# Patient Record
Sex: Female | Born: 1990 | Race: White | Hispanic: No | Marital: Single
Health system: Midwestern US, Community
[De-identification: ages and names within clinical notes are randomized; demographics above are authoritative.]

## PROBLEM LIST (undated history)

## (undated) DIAGNOSIS — E039 Hypothyroidism, unspecified: Secondary | ICD-10-CM

## (undated) DIAGNOSIS — J40 Bronchitis, not specified as acute or chronic: Secondary | ICD-10-CM

## (undated) DIAGNOSIS — J45909 Unspecified asthma, uncomplicated: Secondary | ICD-10-CM

## (undated) HISTORY — PX: COLONOSCOPY: SHX174

## (undated) HISTORY — PX: WISDOM TOOTH EXTRACTION: SHX21

---

## 2015-07-30 ENCOUNTER — Emergency Department: Payer: BLUE CROSS/BLUE SHIELD

## 2015-07-30 ENCOUNTER — Emergency Department
Admission: EM | Admit: 2015-07-30 | Discharge: 2015-07-30 | Disposition: A | Discharge status: Home / Self Care | Payer: BLUE CROSS/BLUE SHIELD | Attending: Emergency Medicine | Admitting: Emergency Medicine

## 2015-07-30 DIAGNOSIS — Z3202 Encounter for pregnancy test, result negative: Secondary | ICD-10-CM | POA: Diagnosis not present

## 2015-07-30 DIAGNOSIS — R1013 Epigastric pain: Secondary | ICD-10-CM

## 2015-07-30 DIAGNOSIS — R109 Unspecified abdominal pain: Secondary | ICD-10-CM | POA: Diagnosis present

## 2015-07-30 DIAGNOSIS — G8929 Other chronic pain: Secondary | ICD-10-CM | POA: Diagnosis not present

## 2015-07-30 LAB — URINALYSIS COMPLETE WITH MICROSCOPIC (ARMC ONLY)
Bacteria, UA: NONE SEEN
Bilirubin Urine: NEGATIVE
Glucose, UA: NEGATIVE mg/dL
Hgb urine dipstick: NEGATIVE
LEUKOCYTES UA: NEGATIVE
NITRITE: NEGATIVE
PROTEIN: NEGATIVE mg/dL
SPECIFIC GRAVITY, URINE: 1.028 (ref 1.005–1.030)
pH: 5 (ref 5.0–8.0)

## 2015-07-30 LAB — CBC WITH DIFFERENTIAL/PLATELET
BASOS ABS: 0.1 10*3/uL (ref 0–0.1)
BASOS PCT: 1 %
EOS ABS: 0.2 10*3/uL (ref 0–0.7)
Eosinophils Relative: 3 %
HCT: 36.2 % (ref 35.0–47.0)
HEMOGLOBIN: 12.5 g/dL (ref 12.0–16.0)
Lymphocytes Relative: 31 %
Lymphs Abs: 2.6 10*3/uL (ref 1.0–3.6)
MCH: 30.3 pg (ref 26.0–34.0)
MCHC: 34.5 g/dL (ref 32.0–36.0)
MCV: 87.8 fL (ref 80.0–100.0)
Monocytes Absolute: 0.5 10*3/uL (ref 0.2–0.9)
Monocytes Relative: 6 %
NEUTROS PCT: 59 %
Neutro Abs: 4.9 10*3/uL (ref 1.4–6.5)
Platelets: 292 10*3/uL (ref 150–440)
RBC: 4.12 MIL/uL (ref 3.80–5.20)
RDW: 13 % (ref 11.5–14.5)
WBC: 8.2 10*3/uL (ref 3.6–11.0)

## 2015-07-30 LAB — COMPREHENSIVE METABOLIC PANEL
ALK PHOS: 81 U/L (ref 38–126)
ALT: 18 U/L (ref 14–54)
ANION GAP: 8 (ref 5–15)
AST: 18 U/L (ref 15–41)
Albumin: 3.4 g/dL — ABNORMAL LOW (ref 3.5–5.0)
BUN: 14 mg/dL (ref 6–20)
CALCIUM: 8.7 mg/dL — AB (ref 8.9–10.3)
CO2: 23 mmol/L (ref 22–32)
CREATININE: 0.83 mg/dL (ref 0.44–1.00)
Chloride: 107 mmol/L (ref 101–111)
Glucose, Bld: 95 mg/dL (ref 65–99)
Potassium: 3.8 mmol/L (ref 3.5–5.1)
Sodium: 138 mmol/L (ref 135–145)
Total Bilirubin: 0.2 mg/dL — ABNORMAL LOW (ref 0.3–1.2)
Total Protein: 6.8 g/dL (ref 6.5–8.1)

## 2015-07-30 LAB — LIPASE, BLOOD: LIPASE: 23 U/L (ref 22–51)

## 2015-07-30 LAB — POCT PREGNANCY, URINE: Preg Test, Ur: NEGATIVE

## 2015-07-30 MED ORDER — DICYCLOMINE HCL 20 MG PO TABS: 20.0000 mg | ORAL_TABLET | Freq: Three times a day (TID) | ORAL | Status: AC | PRN

## 2015-07-30 MED ORDER — DICYCLOMINE HCL 20 MG PO TABS
20.0000 mg | ORAL_TABLET | Freq: Once | ORAL | Status: AC
  Administered 2015-07-30: 20 mg via ORAL
  Filled 2015-07-30: qty 1

## 2015-07-30 NOTE — ED Provider Notes (Signed)
[facility] Emergency Department Provider Note     Time seen: ----------------------------------------- 8:51 PM on 07/30/2015 -----------------------------------------    I have reviewed the triage vital signs and the nursing notes.   HISTORY  Chief Complaint Abdominal Pain    HPI Maureen Santos is a 24 y.o. female who presents ER with mid abdominal pain since last week. Patient said some black stools today, does know she recently took some Pepto-Bismol. Patient states she has chronic abdominal pain, recently moved here from [place]. Has a chronic history of constipation or diarrhea. The GI doctor locally here yet.   No past medical history on file.  There are no active problems to display for this patient.   No past surgical history on file.  Allergies Ibuprofen; Methylprednisolone; Tramadol; Zithromax; and Zoloft  Social History Social History  Substance Use Topics  . Smoking status: Not on file  . Smokeless tobacco: Not on file  . Alcohol Use: Not on file    Review of Systems Constitutional: Negative for fever. Eyes: Negative for visual changes. ENT: Negative for sore throat. Cardiovascular: Negative for chest pain. Respiratory: Negative for shortness of breath. Gastrointestinal: Positive for abdominal pain, negative for vomiting and diarrhea. Genitourinary: Negative for dysuria. Musculoskeletal: Negative for back pain. Skin: Negative for rash. Neurological: Negative for headaches, focal weakness or numbness.  10-point ROS otherwise negative.  ____________________________________________   PHYSICAL EXAM:  VITAL SIGNS: ED Triage Vitals  Enc Vitals Group     BP 07/30/15 1925 145/89 mmHg     Pulse Rate 07/30/15 1925 82     Resp 07/30/15 1925 18     Temp 07/30/15 1925 98.7 F (37.1 C)     Temp Source 07/30/15 1925 Oral     SpO2 07/30/15 1925 97 %     Weight 07/30/15 1925 390 lb (176.903 kg)     Height 07/30/15  1925 5\' 10"  (1.778 m)     Head Cir --      Peak Flow --      Pain Score 07/30/15 1926 6     Pain Loc --      Pain Edu? --      Excl. in GC? --     Constitutional: Alert and oriented. Well appearing and in no distress. Eyes: Conjunctivae are normal. PERRL. Normal extraocular movements. ENT   Head: Normocephalic and atraumatic.   Nose: No congestion/rhinnorhea.   Mouth/Throat: Mucous membranes are moist.   Neck: No stridor. Cardiovascular: Normal rate, regular rhythm. Normal and symmetric distal pulses are present in all extremities. No murmurs, rubs, or gallops. Respiratory: Normal respiratory effort without tachypnea nor retractions. Breath sounds are clear and equal bilaterally. No wheezes/rales/rhonchi. Gastrointestinal: Soft and nontender. No distention. No abdominal bruits.  Musculoskeletal: Nontender with normal range of motion in all extremities. No joint effusions.  No lower extremity tenderness nor edema. Neurologic:  Normal speech and language. No gross focal neurologic deficits are appreciated. Speech is normal. No gait instability. Skin:  Skin is warm, dry and intact. No rash noted. Psychiatric: Mood and affect are normal. Speech and behavior are normal. Patient exhibits appropriate insight and judgment.  ____________________________________________  ED COURSE:  Pertinent labs & imaging results that were available during my care of the patient were reviewed by me and considered in my medical decision making (see chart for details). Patient with nonspecific likely chronic abdominal pain, will check labs and basic x-rays ____________________________________________    LABS (pertinent positives/negatives)  Labs Reviewed  COMPREHENSIVE METABOLIC PANEL -  Abnormal; Notable for the following:    Calcium 8.7 (*)    Albumin 3.4 (*)    Total Bilirubin 0.2 (*)    All other components within normal limits  URINALYSIS COMPLETEWITH MICROSCOPIC ([facility] ONLY) -  Abnormal; Notable for the following:    Color, Urine YELLOW (*)    APPearance CLEAR (*)    Ketones, ur TRACE (*)    Squamous Epithelial / LPF 0-5 (*)    All other components within normal limits  LIPASE, BLOOD  CBC WITH DIFFERENTIAL/PLATELET  POC URINE PREG, ED  POCT PREGNANCY, URINE    RADIOLOGY Images were viewed by me  two-view abdomen IMPRESSION: Possible colonic inflammation. This would be better evaluated with CT of the abdomen and pelvis. ____________________________________________  FINAL ASSESSMENT AND PLAN  Abdominal pain  Plan: Patient with labs and imaging as dictated above. Patient with acute on chronic abdominal pain. Will be discharged with Bentyl, she's can be referred to a primary care doctor for follow-up.   Emily Filbert, MD   Emily Filbert, MD 07/30/15 [phone]

## 2015-07-30 NOTE — ED Notes (Signed)
Mid abd pain since last week, had black stools today no vomiting.

## 2015-07-30 NOTE — Discharge Instructions (Signed)

## 2015-09-15 ENCOUNTER — Encounter: Payer: Self-pay | Admitting: *Deleted

## 2015-09-15 ENCOUNTER — Emergency Department: Payer: BLUE CROSS/BLUE SHIELD

## 2015-09-15 ENCOUNTER — Emergency Department
Admission: EM | Admit: 2015-09-15 | Discharge: 2015-09-16 | Disposition: A | Discharge status: Home / Self Care | Payer: BLUE CROSS/BLUE SHIELD | Attending: Emergency Medicine | Admitting: Emergency Medicine

## 2015-09-15 DIAGNOSIS — Z3202 Encounter for pregnancy test, result negative: Secondary | ICD-10-CM | POA: Diagnosis not present

## 2015-09-15 DIAGNOSIS — R1011 Right upper quadrant pain: Secondary | ICD-10-CM | POA: Diagnosis present

## 2015-09-15 DIAGNOSIS — F419 Anxiety disorder, unspecified: Secondary | ICD-10-CM | POA: Insufficient documentation

## 2015-09-15 DIAGNOSIS — R109 Unspecified abdominal pain: Secondary | ICD-10-CM

## 2015-09-15 DIAGNOSIS — Z720 Tobacco use: Secondary | ICD-10-CM | POA: Insufficient documentation

## 2015-09-15 DIAGNOSIS — R002 Palpitations: Secondary | ICD-10-CM | POA: Insufficient documentation

## 2015-09-15 LAB — BASIC METABOLIC PANEL
ANION GAP: 8 (ref 5–15)
BUN: 18 mg/dL (ref 6–20)
CALCIUM: 8.8 mg/dL — AB (ref 8.9–10.3)
CO2: 23 mmol/L (ref 22–32)
Chloride: 108 mmol/L (ref 101–111)
Creatinine, Ser: 1.06 mg/dL — ABNORMAL HIGH (ref 0.44–1.00)
Glucose, Bld: 99 mg/dL (ref 65–99)
Potassium: 3.8 mmol/L (ref 3.5–5.1)
SODIUM: 139 mmol/L (ref 135–145)

## 2015-09-15 LAB — CBC WITH DIFFERENTIAL/PLATELET
BASOS ABS: 0.1 10*3/uL (ref 0–0.1)
BASOS PCT: 1 %
Eosinophils Absolute: 0.2 10*3/uL (ref 0–0.7)
Eosinophils Relative: 2 %
HEMATOCRIT: 38.5 % (ref 35.0–47.0)
Hemoglobin: 13.2 g/dL (ref 12.0–16.0)
Lymphocytes Relative: 24 %
Lymphs Abs: 3 10*3/uL (ref 1.0–3.6)
MCH: 30.3 pg (ref 26.0–34.0)
MCHC: 34.4 g/dL (ref 32.0–36.0)
MCV: 88 fL (ref 80.0–100.0)
MONO ABS: 0.6 10*3/uL (ref 0.2–0.9)
Monocytes Relative: 5 %
NEUTROS ABS: 8.8 10*3/uL — AB (ref 1.4–6.5)
NEUTROS PCT: 68 %
Platelets: 355 10*3/uL (ref 150–440)
RBC: 4.37 MIL/uL (ref 3.80–5.20)
RDW: 12.6 % (ref 11.5–14.5)
WBC: 12.7 10*3/uL — ABNORMAL HIGH (ref 3.6–11.0)

## 2015-09-15 LAB — URINALYSIS COMPLETE WITH MICROSCOPIC (ARMC ONLY)
BILIRUBIN URINE: NEGATIVE
GLUCOSE, UA: NEGATIVE mg/dL
Hgb urine dipstick: NEGATIVE
LEUKOCYTES UA: NEGATIVE
NITRITE: NEGATIVE
Protein, ur: NEGATIVE mg/dL
SPECIFIC GRAVITY, URINE: 1.029 (ref 1.005–1.030)
pH: 5 (ref 5.0–8.0)

## 2015-09-15 NOTE — ED Notes (Signed)
Pt has abd pain with cramping for 1 day.  Menses now.  No dysuria.  Pt also has a headache.

## 2015-09-15 NOTE — ED Provider Notes (Signed)
[facility] Emergency Department Provider Note  ____________________________________________  Time seen: on arrival  I have reviewed the triage vital signs and the nursing notes.   HISTORY  Chief Complaint Abdominal Pain    HPI Maureen Santos is a 24 y.o. female who presents with complaints of abdominal pain. She reports the pain is crampy and is somewhat diffuse but is perhaps primarily in the right upper quadrant. She notes a long history of vague abdominal pain for which she has been worked up in [place]  and no abnormalities were found. She reports she came today becausethe pain became worse. She reports cramping in the right upper quadrant and also that she had heart palpitations earlier today which is now resolved. She denies nausea vomiting. She is never had abdominal surgeries     No past medical history on file.  There are no active problems to display for this patient.   No past surgical history on file.  Current Outpatient Rx  Name  Route  Sig  Dispense  Refill  . dicyclomine (BENTYL) 20 MG tablet   Oral   Take 1 tablet (20 mg total) by mouth 3 (three) times daily as needed for spasms.   20 tablet   0     Allergies Ibuprofen; Methylprednisolone; Tramadol; Zithromax; and Zoloft  No family history on file.  Social History Social History  Substance Use Topics  . Smoking status: Current Every Day Smoker  . Smokeless tobacco: None  . Alcohol Use: Yes    Review of Systems  Constitutional: Negative for fever. Eyes: Negative for visual changes. ENT: Negative for sore throat Cardiovascular: Negative for chest pain. Respiratory: Negative for shortness of breath. Gastrointestinal: positive for abdominal pain Genitourinary: Negative for dysuria. Musculoskeletal: Negative for back pain. Skin: Negative for rash. Neurological: Negative for headaches or focal weakness Psychiatric:positive for  anxiety    ____________________________________________   PHYSICAL EXAM:  VITAL SIGNS: ED Triage Vitals  Enc Vitals Group     BP 09/15/15 2134 128/88 mmHg     Pulse Rate 09/15/15 2132 102     Resp 09/15/15 2132 20     Temp 09/15/15 2132 98.8 F (37.1 C)     Temp Source 09/15/15 2132 Oral     SpO2 09/15/15 2132 96 %     Weight 09/15/15 2132 380 lb (172.367 kg)     Height 09/15/15 2132  (1.753 m)     Head Cir --      Peak Flow --      Pain Score 09/15/15 2133 8     Pain Loc --      Pain Edu? --      Excl. in GC? --     Constitutional: Alert and oriented. Well appearing and in no distress. Eyes: Conjunctivae are normal.  ENT   Head: Normocephalic and atraumatic.   Mouth/Throat: Mucous membranes are moist. Cardiovascular: Normal rate, regular rhythm. Normal and symmetric distal pulses are present in all extremities. No murmurs, rubs, or gallops. Respiratory: Normal respiratory effort without tachypnea nor retractions. Breath sounds are clear and equal bilaterally.  Gastrointestinal: patient appears to be tender in almost all quadrants but primarily in the right upper quadrant. nonsurgical abdomen.No distention. There is no CVA tenderness. Genitourinary: deferred Musculoskeletal: Nontender with normal range of motion in all extremities. No lower extremity tenderness nor edema. Neurologic:  Normal speech and language. No gross focal neurologic deficits are appreciated. Skin:  Skin is warm, dry and intact. No rash noted. Psychiatric:  Mood and affect are normal. Patient exhibits appropriate insight and judgment.  ____________________________________________    LABS (pertinent positives/negatives)  Labs Reviewed  CBC WITH DIFFERENTIAL/PLATELET - Abnormal; Notable for the following:    WBC 12.7 (*)    Neutro Abs 8.8 (*)    All other components within normal limits  BASIC METABOLIC PANEL - Abnormal; Notable for the following:    Creatinine, Ser 1.06 (*)     Calcium 8.8 (*)    All other components within normal limits  URINALYSIS COMPLETEWITH MICROSCOPIC ([facility] ONLY) - Abnormal; Notable for the following:    Color, Urine YELLOW (*)    APPearance CLEAR (*)    Ketones, ur TRACE (*)    Bacteria, UA RARE (*)    Squamous Epithelial / LPF 0-5 (*)    All other components within normal limits    ____________________________________________   EKG  None  ____________________________________________    RADIOLOGY I have personally reviewed any xrays that were ordered on this patient: Ultrasound of the right upper quadrant is pending  ____________________________________________   PROCEDURES  Procedure(s) performed: none  Critical Care performed: none  ____________________________________________   INITIAL IMPRESSION / ASSESSMENT AND PLAN / ED COURSE  Pertinent labs & imaging results that were available during my care of the patient were reviewed by me and considered in my medical decision making (see chart for details).  Patient with what appears to be relatively chronic abdominal pain. There may be a component of worsening pain in the right upper quadrant today. We will check an ultrasound of the gallbladder to evaluate for possible biliary colic. Her labs are reassuring except for a mildly elevated white blood cell count.  I will sign the patient to Dr. Manson PasseyBrown and have asked him to follow up on the ultrasound  ____________________________________________   FINAL CLINICAL IMPRESSION(S) / ED DIAGNOSES  Final diagnoses:  Abdominal pain     Jene Everyobert Mancel Lardizabal, MD 09/15/15 2320

## 2015-09-16 ENCOUNTER — Emergency Department: Payer: BLUE CROSS/BLUE SHIELD

## 2015-09-16 LAB — PREGNANCY, URINE: Preg Test, Ur: NEGATIVE

## 2015-09-16 MED ORDER — IOHEXOL 300 MG/ML  SOLN
150.0000 mL | Freq: Once | INTRAMUSCULAR | Status: AC | PRN
  Administered 2015-09-16: 150 mL via INTRAVENOUS

## 2015-09-16 MED ORDER — IOHEXOL 240 MG/ML SOLN
25.0000 mL | Freq: Once | INTRAMUSCULAR | Status: AC | PRN
  Administered 2015-09-16: 25 mL via ORAL

## 2015-09-16 NOTE — ED Provider Notes (Signed)
I assumed care of the patient 11:00 PM from Dr. Cyril LoosenKinner. Ultrasound results revealed  US Abdomen Limited RUQ (Final result) Result time: 09/15/15 23:31:08   Final result by Rad Results In Interface (09/15/15 23:31:08)   Narrative:   CLINICAL DATA: Acute onset of right upper quadrant abdominal pain. Initial encounter.  EXAM: US ABDOMEN LIMITED - RIGHT UPPER QUADRANT  COMPARISON: None.  FINDINGS: Gallbladder:  No gallstones or wall thickening visualized. No sonographic Murphy sign noted.  Common bile duct:  Diameter: 0.3 cm, within normal limits in caliber.  Liver:  No focal lesion identified. Within normal limits in parenchymal echogenicity.  IMPRESSION: Unremarkable ultrasound of the right upper quadrant.   Electronically Signed By: Roanna RaiderJeffery Chang M.D. On: 09/15/2015 23:31         I evaluated patient who admitted to generalized abdominal pain. Tender to palpation in all quadrants as such CT scan of the abdomen and pelvis  Performed    CT Abdomen Pelvis W Contrast (Final result) Result time: 09/16/15 02:57:35   Final result by Rad Results In Interface (09/16/15 02:57:35)   Narrative:   CLINICAL DATA: Acute onset of lower abdominal pain and cramping. Headache. Initial encounter.  EXAM: CT ABDOMEN AND PELVIS WITH CONTRAST  TECHNIQUE: Multidetector CT imaging of the abdomen and pelvis was performed using the standard protocol following bolus administration of intravenous contrast.  CONTRAST: 150mL OMNIPAQUE IOHEXOL 300 MG/ML SOLN  COMPARISON: Ultrasound of the right upper quadrant performed 09/14/2014  FINDINGS: The visualized lung bases are clear.  The liver and spleen are unremarkable in appearance. The gallbladder is within normal limits. The pancreas and adrenal glands are unremarkable.  The kidneys are unremarkable in appearance. There is no evidence of hydronephrosis. No renal or ureteral stones are seen. No perinephric stranding  is appreciated.  No free fluid is identified. The small bowel is unremarkable in appearance. The stomach is within normal limits. No acute vascular abnormalities are seen.  The appendix is normal in caliber, without evidence of appendicitis. The colon is unremarkable in appearance.  The bladder is mildly distended and grossly unremarkable. The uterus is unremarkable in appearance. The ovaries are relatively symmetric. No suspicious adnexal masses are seen. No inguinal lymphadenopathy is seen.  No acute osseous abnormalities are identified.  IMPRESSION: Unremarkable contrast-enhanced CT of the abdomen and pelvis.   Electronically Signed By: Roanna RaiderJeffery Chang M.D. On: 09/16/2015 02:57   Spoke with the patient at length that she stated that she's had issues like this in the past which she saw a gastroenterologist but no clear etiology was discovered. Patient stated she had a colonoscopy performed 2 years ago which showed 2 polyps. I will refer the patient to Dr. Marva PandaSkulskie gastroenterologist on call  Darci Currentandolph N Brown, MD 09/16/15 [phone]

## 2015-09-16 NOTE — Discharge Instructions (Signed)

## 2015-11-12 ENCOUNTER — Emergency Department: Payer: BLUE CROSS/BLUE SHIELD

## 2015-11-12 ENCOUNTER — Emergency Department
Admission: EM | Admit: 2015-11-12 | Discharge: 2015-11-12 | Disposition: A | Discharge status: Home / Self Care | Payer: BLUE CROSS/BLUE SHIELD | Attending: Emergency Medicine | Admitting: Emergency Medicine

## 2015-11-12 ENCOUNTER — Encounter: Payer: Self-pay | Admitting: Emergency Medicine

## 2015-11-12 DIAGNOSIS — Z3202 Encounter for pregnancy test, result negative: Secondary | ICD-10-CM | POA: Insufficient documentation

## 2015-11-12 DIAGNOSIS — R1084 Generalized abdominal pain: Secondary | ICD-10-CM

## 2015-11-12 DIAGNOSIS — R110 Nausea: Secondary | ICD-10-CM | POA: Insufficient documentation

## 2015-11-12 DIAGNOSIS — F17200 Nicotine dependence, unspecified, uncomplicated: Secondary | ICD-10-CM | POA: Insufficient documentation

## 2015-11-12 DIAGNOSIS — R109 Unspecified abdominal pain: Secondary | ICD-10-CM | POA: Diagnosis present

## 2015-11-12 LAB — CBC WITH DIFFERENTIAL/PLATELET
Basophils Absolute: 0.1 10*3/uL (ref 0–0.1)
Basophils Relative: 1 %
EOS ABS: 0.2 10*3/uL (ref 0–0.7)
EOS PCT: 2 %
HCT: 39.9 % (ref 35.0–47.0)
HEMOGLOBIN: 13.3 g/dL (ref 12.0–16.0)
LYMPHS ABS: 3.6 10*3/uL (ref 1.0–3.6)
LYMPHS PCT: 31 %
MCH: 29.6 pg (ref 26.0–34.0)
MCHC: 33.3 g/dL (ref 32.0–36.0)
MCV: 89 fL (ref 80.0–100.0)
MONOS PCT: 4 %
Monocytes Absolute: 0.5 10*3/uL (ref 0.2–0.9)
NEUTROS PCT: 62 %
Neutro Abs: 7.1 10*3/uL — ABNORMAL HIGH (ref 1.4–6.5)
Platelets: 343 10*3/uL (ref 150–440)
RBC: 4.48 MIL/uL (ref 3.80–5.20)
RDW: 12.1 % (ref 11.5–14.5)
WBC: 11.6 10*3/uL — ABNORMAL HIGH (ref 3.6–11.0)

## 2015-11-12 LAB — URINALYSIS COMPLETE WITH MICROSCOPIC (ARMC ONLY)
Bilirubin Urine: NEGATIVE
Glucose, UA: NEGATIVE mg/dL
LEUKOCYTES UA: NEGATIVE
NITRITE: NEGATIVE
PH: 5 (ref 5.0–8.0)
PROTEIN: 30 mg/dL — AB
Specific Gravity, Urine: 1.031 — ABNORMAL HIGH (ref 1.005–1.030)

## 2015-11-12 LAB — COMPREHENSIVE METABOLIC PANEL
ALK PHOS: 91 U/L (ref 38–126)
ALT: 24 U/L (ref 14–54)
ANION GAP: 9 (ref 5–15)
AST: 20 U/L (ref 15–41)
Albumin: 3.9 g/dL (ref 3.5–5.0)
BILIRUBIN TOTAL: 0.6 mg/dL (ref 0.3–1.2)
BUN: 21 mg/dL — ABNORMAL HIGH (ref 6–20)
CALCIUM: 9.1 mg/dL (ref 8.9–10.3)
CO2: 25 mmol/L (ref 22–32)
Chloride: 107 mmol/L (ref 101–111)
Creatinine, Ser: 0.98 mg/dL (ref 0.44–1.00)
Glucose, Bld: 136 mg/dL — ABNORMAL HIGH (ref 65–99)
Potassium: 3.9 mmol/L (ref 3.5–5.1)
SODIUM: 141 mmol/L (ref 135–145)
TOTAL PROTEIN: 7.6 g/dL (ref 6.5–8.1)

## 2015-11-12 LAB — PREGNANCY, URINE: PREG TEST UR: NEGATIVE

## 2015-11-12 LAB — POCT PREGNANCY, URINE: PREG TEST UR: NEGATIVE

## 2015-11-12 LAB — LIPASE, BLOOD: LIPASE: 23 U/L (ref 11–51)

## 2015-11-12 MED ORDER — DICYCLOMINE HCL 20 MG PO TABS: 20.0000 mg | ORAL_TABLET | Freq: Three times a day (TID) | ORAL | Status: AC | PRN

## 2015-11-12 MED ORDER — IOHEXOL 350 MG/ML SOLN
125.0000 mL | Freq: Once | INTRAVENOUS | Status: AC | PRN
  Administered 2015-11-12: 125 mL via INTRAVENOUS

## 2015-11-12 MED ORDER — DICYCLOMINE HCL 10 MG PO CAPS
10.0000 mg | ORAL_CAPSULE | Freq: Once | ORAL | Status: AC
Start: 2015-11-12 — End: 2015-11-12
  Administered 2015-11-12: 10 mg via ORAL
  Filled 2015-11-12: qty 1

## 2015-11-12 MED ORDER — IOHEXOL 240 MG/ML SOLN
25.0000 mL | Freq: Once | INTRAMUSCULAR | Status: AC | PRN
  Administered 2015-11-12: 25 mL via ORAL

## 2015-11-12 NOTE — ED Provider Notes (Signed)
[facility] Emergency Department Provider Note  ____________________________________________  Time seen: 2:30 AM  I have reviewed the triage vital signs and the nursing notes.   HISTORY  Chief Complaint Abdominal Pain     HPI Maureen Santos is a 24 y.o. female presents with right upper quadrant/right lower quadrant 8 out of 10 abdominal pain4 days. Patient admits to nausea however no vomiting or diarrhea. Patient denies any dysuria no urinary frequency or urgency.patient states that she isconcerned that this may be "appendicitis"     Past medical history none  There are no active problems to display for this patient.   Past surgical history None  Current Outpatient Rx  Name  Route  Sig  Dispense  Refill  . dicyclomine (BENTYL) 20 MG tablet   Oral   Take 1 tablet (20 mg total) by mouth 3 (three) times daily as needed for spasms.   20 tablet   0     Allergies Ibuprofen; Methylprednisolone; Tramadol; Zithromax; and Zoloft  History reviewed. No pertinent family history.  Social History Social History  Substance Use Topics  . Smoking status: Current Every Day Smoker  . Smokeless tobacco: None  . Alcohol Use: Yes    Review of Systems  Constitutional: Negative for fever. Eyes: Negative for visual changes. ENT: Negative for sore throat. Cardiovascular: Negative for chest pain. Respiratory: Negative for shortness of breath. Gastrointestinal:positive for abdominal pain and nausea Genitourinary: Negative for dysuria. Musculoskeletal: Negative for back pain. Skin: Negative for rash. Neurological: Negative for headaches, focal weakness or numbness.   10-point ROS otherwise negative.  ____________________________________________   PHYSICAL EXAM:  VITAL SIGNS: ED Triage Vitals  Enc Vitals Group     BP 11/12/15 0021 137/90 mmHg     Pulse Rate 11/12/15 0021 96     Resp 11/12/15 0244 18     Temp 11/12/15 0021 97.9 F (36.6 C)      Temp Source 11/12/15 0021 Oral     SpO2 11/12/15 0021 96 %     Weight 11/12/15 0021 397 lb (180.078 kg)     Height 11/12/15 0021 5\' 9"  (1.753 m)     Head Cir --      Peak Flow --      Pain Score 11/12/15 0021 8     Pain Loc --      Pain Edu? --      Excl. in GC? --      Constitutional: Alert and oriented. Well appearing and in no distress. Eyes: Conjunctivae are normal. PERRL. Normal extraocular movements. ENT   Head: Normocephalic and atraumatic.   Nose: No congestion/rhinnorhea.   Mouth/Throat: Mucous membranes are moist.   Neck: No stridor. Hematological/Lymphatic/Immunilogical: No cervical lymphadenopathy. Cardiovascular: Normal rate, regular rhythm. Normal and symmetric distal pulses are present in all extremities. No murmurs, rubs, or gallops. Respiratory: Normal respiratory effort without tachypnea nor retractions. Breath sounds are clear and equal bilaterally. No wheezes/rales/rhonchi. Gastrointestinal: right upper quadrant/right lower quadrant tenderness to palpation No distention. There is no CVA tenderness. Genitourinary: deferred Musculoskeletal: Nontender with normal range of motion in all extremities. No joint effusions.  No lower extremity tenderness nor edema. Neurologic:  Normal speech and language. No gross focal neurologic deficits are appreciated. Speech is normal.  Skin:  Skin is warm, dry and intact. No rash noted. Psychiatric: Mood and affect are normal. Speech and behavior are normal. Patient exhibits appropriate insight and judgment.  ____________________________________________    LABS (pertinent positives/negatives)  Labs Reviewed  CBC WITH  DIFFERENTIAL/PLATELET - Abnormal; Notable for the following:    WBC 11.6 (*)    Neutro Abs 7.1 (*)    All other components within normal limits  COMPREHENSIVE METABOLIC PANEL - Abnormal; Notable for the following:    Glucose, Bld 136 (*)    BUN 21 (*)    All other components within normal  limits  URINALYSIS COMPLETEWITH MICROSCOPIC ([facility] ONLY) - Abnormal; Notable for the following:    Color, Urine YELLOW (*)    APPearance HAZY (*)    Ketones, ur TRACE (*)    Specific Gravity, Urine 1.031 (*)    Hgb urine dipstick 1+ (*)    Protein, ur 30 (*)    Bacteria, UA RARE (*)    Squamous Epithelial / LPF 0-5 (*)    All other components within normal limits  LIPASE, BLOOD  PREGNANCY, URINE  POCT PREGNANCY, URINE     ________________________________ RADIOLOGY    CT Abdomen Pelvis W Contrast (Final result) Result time: 11/12/15 03:47:43   Final result by Rad Results In Interface (11/12/15 03:47:43)   Narrative:   CLINICAL DATA: RIGHT lower quadrant pain for 4 days, nausea and fever beginning today. Weakness.  EXAM: CT ABDOMEN AND PELVIS WITH CONTRAST  TECHNIQUE: Multidetector CT imaging of the abdomen and pelvis was performed using the standard protocol following bolus administration of intravenous contrast.  CONTRAST: OMNIPAQUE IOHEXOL 350 MG/ML SOLN  COMPARISON: CT abdomen and pelvis September 16, 2015  FINDINGS: Large body habitus results in overall noisy image quality.  LUNG BASES: Included view of the lung bases are clear. Visualized heart and pericardium are unremarkable. Small amount of contrast in the distal esophagus could represent residual or refluxed contrast.  SOLID ORGANS: The liver demonstrates faint hypodensity around the hilum, most compatible with focal fatty infiltration, otherwise unremarkable. Spleen, gallbladder, pancreas and adrenal glands are unremarkable.  GASTROINTESTINAL TRACT: Small hiatal hernia. The stomach, small and large bowel are normal in course and caliber without inflammatory changes. Enteric contrast has not yet reached the distal small bowel. Mild amount of retained large bowel stool. Normal appendix.  KIDNEYS/ URINARY TRACT: Kidneys are orthotopic, demonstrating symmetric enhancement. No nephrolithiasis,  hydronephrosis or solid renal masses. The unopacified ureters are normal in course and caliber. Urinary bladder is partially distended and unremarkable.  PERITONEUM/RETROPERITONEUM: Aortoiliac vessels are normal in course and caliber. No lymphadenopathy by CT size criteria. Subcentimeter RIGHT lower quadrant lymph nodes are likely reactive. Internal reproductive organs are unremarkable. No intraperitoneal free fluid nor free air.  SOFT TISSUE/OSSEOUS STRUCTURES: Non-suspicious. Moderate broad-based disc bulge L3-4 resulting mild canal stenosis.  IMPRESSION: No acute intra-abdominal or pelvic process. Normal appendix.  Mild amount of retained large bowel stool without bowel obstruction.   Electronically Signed By: Awilda Metro M.D.     INITIAL IMPRESSION / ASSESSMENT AND PLAN / ED COURSE  Pertinent labs & imaging results that were available during my care of the patient were reviewed by me and considered in my medical decision making (see chart for details).    ____________________________________________   FINAL CLINICAL IMPRESSION(S) / ED DIAGNOSES  Final diagnoses:  Generalized abdominal pain      Darci Current, MD 11/12/15 [phone]

## 2015-11-12 NOTE — Discharge Instructions (Signed)

## 2015-11-12 NOTE — ED Notes (Signed)
Pt transported to CT via stretcher.  

## 2015-11-22 ENCOUNTER — Encounter: Payer: Self-pay | Admitting: Emergency Medicine

## 2015-11-22 ENCOUNTER — Emergency Department
Admission: EM | Admit: 2015-11-22 | Discharge: 2015-11-22 | Disposition: A | Discharge status: Home / Self Care | Payer: BLUE CROSS/BLUE SHIELD | Attending: Emergency Medicine | Admitting: Emergency Medicine

## 2015-11-22 DIAGNOSIS — T50905A Adverse effect of unspecified drugs, medicaments and biological substances, initial encounter: Secondary | ICD-10-CM

## 2015-11-22 DIAGNOSIS — T363X5A Adverse effect of macrolides, initial encounter: Secondary | ICD-10-CM | POA: Insufficient documentation

## 2015-11-22 DIAGNOSIS — R0789 Other chest pain: Secondary | ICD-10-CM | POA: Insufficient documentation

## 2015-11-22 DIAGNOSIS — R0602 Shortness of breath: Secondary | ICD-10-CM | POA: Insufficient documentation

## 2015-11-22 DIAGNOSIS — F17210 Nicotine dependence, cigarettes, uncomplicated: Secondary | ICD-10-CM | POA: Insufficient documentation

## 2015-11-22 DIAGNOSIS — T7840XA Allergy, unspecified, initial encounter: Secondary | ICD-10-CM | POA: Diagnosis present

## 2015-11-22 HISTORY — DX: Bronchitis, not specified as acute or chronic: J40

## 2015-11-22 MED ORDER — ALBUTEROL SULFATE HFA 108 (90 BASE) MCG/ACT IN AERS
2.0000 | INHALATION_SPRAY | Freq: Four times a day (QID) | RESPIRATORY_TRACT | Status: AC | PRN
Start: 2015-11-22 — End: ?

## 2015-11-22 MED ORDER — CEPHALEXIN 500 MG PO CAPS: 500.0000 mg | ORAL_CAPSULE | Freq: Three times a day (TID) | ORAL | Status: AC

## 2015-11-22 NOTE — ED Provider Notes (Signed)
[facility] Emergency Department Provider Note ____________________________________________  Time seen: Approximately 3:41 PM  I have reviewed the triage vital signs and the nursing notes.   HISTORY  Chief Complaint Allergic Reaction   HPI Maureen Santos is a 24 y.o. female who presents to the emergency department for evaluation of medication reaction. She states that she was started on clarithromycin yesterday by her primary care provider diagnosed her with a sinus infection. She states that intermittently since the second dose she has had to use her albuterol inhaler because she's had a little bit of shortness of breath and chest tightness similar to a previous incident with azithromycin. She denies rash, itching, or hives. She states that after using the albuterol, her symptoms are completely relieved. Her last dose of clarithromycin was 10 AM.   Past Medical History  Diagnosis Date  . Bronchitis     There are no active problems to display for this patient.   History reviewed. No pertinent past surgical history.  Current Outpatient Rx  Name  Route  Sig  Dispense  Refill  . albuterol (PROVENTIL HFA;VENTOLIN HFA) 108 (90 Base) MCG/ACT inhaler   Inhalation   Inhale 2 puffs into the lungs every 6 (six) hours as needed for wheezing or shortness of breath.   1 Inhaler   2   . cephALEXin (KEFLEX) 500 MG capsule   Oral   Take 1 capsule (500 mg total) by mouth 3 (three) times daily.   40 capsule   0   . dicyclomine (BENTYL) 20 MG tablet   Oral   Take 1 tablet (20 mg total) by mouth 3 (three) times daily as needed for spasms.   20 tablet   0   . dicyclomine (BENTYL) 20 MG tablet   Oral   Take 1 tablet (20 mg total) by mouth 3 (three) times daily as needed for spasms.   30 tablet   0     Allergies Clindamycin/lincomycin; Ibuprofen; Methylprednisolone; Tramadol; Zithromax; and Zoloft  No family history on file.  Social History Social  History  Substance Use Topics  . Smoking status: Current Every Day Smoker -- 0.25 packs/day    Types: Cigarettes  . Smokeless tobacco: None  . Alcohol Use: Yes    Review of Systems Constitutional: No fever/chills Eyes: No visual changes. ENT: No sore throat. Negative for feeling of swelling in the throat Cardiovascular: Denies chest pain. Respiratory: Intermittent shortness of breath. Gastrointestinal: No abdominal pain.  No nausea, no vomiting.  No diarrhea.  No constipation. Genitourinary: Negative for dysuria. Musculoskeletal: Negative for back pain. Skin: Negative for rash. Neurological: Negative for headaches, focal weakness or numbness.  10-point ROS otherwise negative.  ____________________________________________   PHYSICAL EXAM:  VITAL SIGNS: ED Triage Vitals  Enc Vitals Group     BP 11/22/15 1358 117/76 mmHg     Pulse Rate 11/22/15 1358 96     Resp 11/22/15 1358 20     Temp 11/22/15 1358 98.5 F (36.9 C)     Temp Source 11/22/15 1358 Oral     SpO2 11/22/15 1358 97 %     Weight 11/22/15 1358 387 lb (175.542 kg)     Height 11/22/15 1358  (1.753 m)     Head Cir --      Peak Flow --      Pain Score 11/22/15 1359 0     Pain Loc --      Pain Edu? --      Excl. in  GC? --     Constitutional: Alert and oriented. Well appearing and in no acute distress. Eyes: Conjunctivae are normal. PERRL. EOMI. Head: Atraumatic. Nose: No congestion/rhinnorhea. Mouth/Throat: Mucous membranes are moist.  Oropharynx non-erythematous. Neck: No stridor.   Cardiovascular: Normal rate, regular rhythm. Grossly normal heart sounds.  Good peripheral circulation. Respiratory: Normal respiratory effort.  No retractions. Lungs CTAB. Gastrointestinal: Soft and nontender. No distention. No abdominal bruits. No CVA tenderness. Musculoskeletal: No lower extremity tenderness nor edema.  No joint effusions. Neurologic:  Normal speech and language. No gross focal neurologic deficits are  appreciated. No gait instability. Skin:  Skin is warm, dry and intact. No rash noted. Psychiatric: Mood and affect are normal. Speech and behavior are normal.  ____________________________________________   LABS (all labs ordered are listed, but only abnormal results are displayed)  Labs Reviewed - No data to display ____________________________________________  EKG   ____________________________________________  RADIOLOGY  Not indicated ____________________________________________   PROCEDURES  Procedure(s) performed: None  Critical Care performed: No  ____________________________________________   INITIAL IMPRESSION / ASSESSMENT AND PLAN / ED COURSE  Pertinent labs & imaging results that were available during my care of the patient were reviewed by me and considered in my medical decision making (see chart for details).  Patient was advised to stop the clarithromycin. She will be placed on Keflex in its place. She was advised to continue using the albuterol inhaler as needed and she will be given a refill as well. She was advised to take Benadryl every 6 hours for the next 24 hours. She was advised to return to the emergency department for any symptom that changes or worsens if she is unable to see her primary care provider. ____________________________________________   FINAL CLINICAL IMPRESSION(S) / ED DIAGNOSES  Final diagnoses:  Medication reaction, initial encounter      Chinita PesterCari B Reyana Leisey, FNP 11/22/15 1545  Phineas SemenGraydon Goodman, MD 11/23/15 1519

## 2015-11-22 NOTE — Discharge Instructions (Signed)
Drug Allergy °Allergic reactions to medicines are common. Some allergic reactions are mild. A delayed type of drug allergy that occurs 1 week or more after exposure to a medicine or vaccine is called serum sickness. A life-threatening, sudden (acute) allergic reaction that involves the whole body is called anaphylaxis. °CAUSES  °"True" drug allergies occur when there is an allergic reaction to a medicine. This is caused by overactivity of the immune system. First, the body becomes sensitized. The immune system is triggered by your first exposure to the medicine. Following this first exposure, future exposure to the same medicine may be life-threatening. °Almost any medicine can cause an allergic reaction. Common ones are: °· Penicillin. °· Sulfonamides (sulfa drugs). °· Local anesthetics. °· X-ray dyes that contain iodine. °SYMPTOMS  °Common symptoms of a minor allergic reaction are: °· Swelling around the mouth. °· An itchy red rash or hives. °· Vomiting or diarrhea. °Anaphylaxis can cause swelling of the mouth and throat. This makes it difficult to breathe and swallow. Severe reactions can be fatal within seconds, even after exposure to only a trace amount of the drug that causes the reaction. °HOME CARE INSTRUCTIONS °· If you are unsure of what caused your reaction, write down: °¨ The names of the medicines you took. °¨ How much medicine you took. °¨ How you took the medicine, such as whether you took a pill, injected the medicine, or applied it to your skin. °¨ All of the things you ate and drank. °¨ The date and time of your reaction. °¨ The symptoms of the reaction. °· You may want to follow up with an allergy specialist after the reaction has cleared in order to be tested to confirm the allergy. It is important to confirm that your reaction is an allergy, not just a side effect to the medicine. If you have a true allergy to a medicine, this may prevent that medicine and related medicines from being given to  you when you are very ill. °· If you have hives or a rash: °¨ Take medicines as directed by your caregiver. °¨ You may use an over-the-counter antihistamine (diphenhydramine) as needed. °¨ Apply cold compresses to the skin or take baths in cool water. Avoid hot baths or showers. °· If you are severely allergic: °¨ Continuous observation after a severe reaction may be needed. Hospitalization is often required. °¨ Wear a medical alert bracelet or necklace stating your allergy. °¨ You and your family must learn how to use an anaphylaxis kit or give an epinephrine injection to temporarily treat an emergency allergic reaction. If you have had a severe reaction, always carry your epinephrine injection or anaphylaxis kit with you. This can be lifesaving if you have a severe reaction. °· Do not drive or perform tasks after treatment until the medicines used to treat your reaction have worn off, or until your caregiver says it is okay. °· If you have a drug allergy that was confirmed by your health care provider: °¨ Carry information about the drug allergy with you at all times. °¨ Always check with a pharmacist before taking any over-the-counter medicine. °SEEK MEDICAL CARE IF:  °· You think you had an allergic reaction. Symptoms usually start within 30 minutes after exposure. °· Symptoms are getting worse rather than better. °· You develop new symptoms. °· The symptoms that brought you to your caregiver return. °SEEK IMMEDIATE MEDICAL CARE IF:  °· You have swelling of the mouth, difficulty breathing, or wheezing. °· You have a tight   feeling in your chest or throat.  You develop hives, swelling, or itching all over your body.  You develop severe vomiting or diarrhea.  You feel faint or pass out. This is an emergency. Use your epinephrine injection or anaphylaxis kit as you have been instructed. Call for emergency medical help. Even if you improve after the injection, you need to be examined at a hospital emergency  department. MAKE SURE YOU:   Understand these instructions.  Will watch your condition.  Will get help right away if you are not doing well or get worse.   This information is not intended to replace advice given to you by your health care provider. Make sure you discuss any questions you have with your health care provider.   Document Released: 11/08/2005 Document Revised: 11/29/2014 Document Reviewed: 06/10/2015 Elsevier Interactive Patient Education [employer].

## 2015-11-22 NOTE — ED Notes (Signed)
Discussed discharge instructions, prescriptions, and follow-up care with patient. No questions or concerns at this time. Pt stable at discharge.  

## 2015-11-22 NOTE — ED Notes (Signed)
Pt has c/o intermittent difficulty breathing which she says she is unclear if it is related to antibiotic usage or infection itself. Pt states she feels tightness and shortness of breath in her chest relieved by albuterol, no c/o swelling, rash, or itching.

## 2015-11-22 NOTE — ED Notes (Signed)
States began clarithromycin yesterday for sinus infection and states has had to use proair inhaler 4 times in past 24 hours which is not usual for her. Concerned may be allergic to antibiotic. Has known allergy to zithromax.

## 2015-11-24 ENCOUNTER — Encounter: Payer: Self-pay | Admitting: Emergency Medicine

## 2015-11-24 ENCOUNTER — Emergency Department: Payer: BLUE CROSS/BLUE SHIELD

## 2015-11-24 ENCOUNTER — Emergency Department
Admission: EM | Admit: 2015-11-24 | Discharge: 2015-11-24 | Disposition: A | Discharge status: Home / Self Care | Payer: BLUE CROSS/BLUE SHIELD | Attending: Emergency Medicine | Admitting: Emergency Medicine

## 2015-11-24 DIAGNOSIS — J45909 Unspecified asthma, uncomplicated: Secondary | ICD-10-CM | POA: Diagnosis not present

## 2015-11-24 DIAGNOSIS — Z792 Long term (current) use of antibiotics: Secondary | ICD-10-CM | POA: Insufficient documentation

## 2015-11-24 DIAGNOSIS — R05 Cough: Secondary | ICD-10-CM | POA: Diagnosis present

## 2015-11-24 DIAGNOSIS — F17210 Nicotine dependence, cigarettes, uncomplicated: Secondary | ICD-10-CM | POA: Insufficient documentation

## 2015-11-24 DIAGNOSIS — J209 Acute bronchitis, unspecified: Secondary | ICD-10-CM

## 2015-11-24 MED ORDER — PSEUDOEPH-BROMPHEN-DM 30-2-10 MG/5ML PO SYRP: 5.0000 mL | ORAL_SOLUTION | Freq: Four times a day (QID) | ORAL | Status: AC | PRN

## 2015-11-24 MED ORDER — IPRATROPIUM-ALBUTEROL 0.5-2.5 (3) MG/3ML IN SOLN
3.0000 mL | Freq: Once | RESPIRATORY_TRACT | Status: AC
Start: 2015-11-24 — End: 2015-11-24
  Administered 2015-11-24: 3 mL via RESPIRATORY_TRACT
  Filled 2015-11-24: qty 3

## 2015-11-24 NOTE — ED Provider Notes (Signed)
[facility] Emergency Department Provider Note  ____________________________________________  Time seen: Approximately 6:57 PM  I have reviewed the triage vital signs and the nursing notes.   HISTORY  Chief Complaint Cough    HPI Maureen Santos is a 25 y.o. female patient state cough for one day. Patient states she's had difficulty breathing onset 2 days ago status post starting a different antibiotics for sinus infection. Patient state she was recently placed on clindamycin 3 days ago but because of difficulty breathing she thought she was having a reaction to the antibiotics. Emergency room clindamycin was discontinued patient started on Keflex. Patient states that is post use Keflex she fine she has to continue using her inhaler. When questioning patient states she last used her inhalers 1630 hrs. today. Patient clothesing reek  of  tobacco smoke and when asked about tobacco usage she states she has not smoke as much as usual in the past 3 days.Patient denies any pain at this time.   Past Medical History  Diagnosis Date  . Bronchitis     There are no active problems to display for this patient.   History reviewed. No pertinent past surgical history.  Current Outpatient Rx  Name  Route  Sig  Dispense  Refill  . albuterol (PROVENTIL HFA;VENTOLIN HFA) 108 (90 Base) MCG/ACT inhaler   Inhalation   Inhale 2 puffs into the lungs every 6 (six) hours as needed for wheezing or shortness of breath.   1 Inhaler   2   . brompheniramine-pseudoephedrine-DM 30-2-10 MG/5ML syrup   Oral   Take 5 mLs by mouth 4 (four) times daily as needed.   120 mL   0   . cephALEXin (KEFLEX) 500 MG capsule   Oral   Take 1 capsule (500 mg total) by mouth 3 (three) times daily.   40 capsule   0   . dicyclomine (BENTYL) 20 MG tablet   Oral   Take 1 tablet (20 mg total) by mouth 3 (three) times daily as needed for spasms.   20 tablet   0   . dicyclomine (BENTYL) 20 MG  tablet   Oral   Take 1 tablet (20 mg total) by mouth 3 (three) times daily as needed for spasms.   30 tablet   0     Allergies Clindamycin/lincomycin; Ibuprofen; Methylprednisolone; Tramadol; Zithromax; Zoloft; and Clarithromycin  No family history on file.  Social History Social History  Substance Use Topics  . Smoking status: Current Every Day Smoker -- 0.25 packs/day    Types: Cigarettes  . Smokeless tobacco: None  . Alcohol Use: Yes    Review of Systems Constitutional: No fever/chills Eyes: No visual changes. ENT: No sore throat. Cardiovascular: Denies chest pain. Respiratory: Denies shortness of breath. Gastrointestinal: No abdominal pain.  No nausea, no vomiting.  No diarrhea.  No constipation. Genitourinary: Negative for dysuria. Musculoskeletal: Negative for back pain. Skin: Negative for rash. Neurological: Negative for headaches, focal weakness or numbness. 10-point ROS otherwise negative.  ____________________________________________   PHYSICAL EXAM:  VITAL SIGNS: ED Triage Vitals  Enc Vitals Group     BP 11/24/15 1825 138/79 mmHg     Pulse Rate 11/24/15 1825 92     Resp 11/24/15 1825 18     Temp 11/24/15 1825 98.2 F (36.8 C)     Temp Source 11/24/15 1825 Oral     SpO2 11/24/15 1825 98 %     Weight 11/24/15 1825 387 lb (175.542 kg)     Height  11/24/15 1825 5\' 9"  (1.753 m)     Head Cir --      Peak Flow --      Pain Score 11/24/15 1828 0     Pain Loc --      Pain Edu? --      Excl. in GC? --     Constitutional: Alert and oriented. Well appearing and in no acute distress. Morbid obesity Eyes: Conjunctivae are normal. PERRL. EOMI. Head: Atraumatic. Nose: No congestion/rhinnorhea. Mouth/Throat: Mucous membranes are moist.  Oropharynx non-erythematous. Neck: No stridor.  No cervical spine tenderness to palpation. Hematological/Lymphatic/Immunilogical: No cervical lymphadenopathy. Cardiovascular: Normal rate, regular rhythm. Grossly normal heart  sounds.  Good peripheral circulation. Respiratory: Normal respiratory effort.  No retractions. Lungs CTAB. Gastrointestinal: Soft and nontender. No distention. No abdominal bruits. No CVA tenderness. Musculoskeletal: No lower extremity tenderness nor edema.  No joint effusions. Neurologic:  Normal speech and language. No gross focal neurologic deficits are appreciated. No gait instability. Skin:  Skin is warm, dry and intact. No rash noted. Psychiatric: Mood and affect are normal. Speech and behavior are normal.  ____________________________________________   LABS (all labs ordered are listed, but only abnormal results are displayed)  Labs Reviewed - No data to display ____________________________________________  EKG   ____________________________________________  RADIOLOGY  No acute findings. Mild bronchial thickening right greater than left. I, Joni Reiningonald K Fidel Caggiano, personally viewed and evaluated these images (plain radiographs) as part of my medical decision making, as well as reviewing the written report by the radiologist.  ____________________________________________   PROCEDURES  Procedure(s) performed: None  Critical Care performed: No  ____________________________________________   INITIAL IMPRESSION / ASSESSMENT AND PLAN / ED COURSE  Pertinent labs & imaging results that were available during my care of the patient were reviewed by me and considered in my medical decision making (see chart for details).  Bronchitis.Continue taking anabiotic as directed. Patient given a prescription for Bromfed-DM and advised to follow-up family doctor in 2-3 days if no improvement. ____________________________________________   FINAL CLINICAL IMPRESSION(S) / ED DIAGNOSES  Final diagnoses:  Bronchitis with asthma, subacute      Joni ReiningRonald K Elizabella Nolet, PA-C 11/24/15 2008  Loleta Roseory Forbach, MD 11/24/15 2330

## 2015-11-24 NOTE — ED Notes (Signed)
Cough x 1 day.  Difficulty breathing, onset Saturday.   Patient diagnosed with Sinus Infection by PCP on Friday.  Seen in ED on Saturday to switch antibiotics because of difficulty breathing and thought she was having a reaction to ANTBX.  Started on Keflex.  Has been using inhaler frequently today, last used at 1630.

## 2015-11-24 NOTE — ED Notes (Signed)
AAOx3.  Skin warm and dry.  NAD 

## 2015-12-04 ENCOUNTER — Encounter: Payer: Self-pay | Admitting: *Deleted

## 2015-12-05 ENCOUNTER — Encounter: Payer: Self-pay | Admitting: *Deleted

## 2015-12-05 ENCOUNTER — Ambulatory Visit: Payer: BLUE CROSS/BLUE SHIELD | Admitting: Anesthesiology

## 2015-12-05 ENCOUNTER — Ambulatory Visit
Admission: RE | Admit: 2015-12-05 | Discharge: 2015-12-05 | Disposition: A | Discharge status: Home / Self Care | Payer: BLUE CROSS/BLUE SHIELD | Source: Ambulatory Visit | Attending: Gastroenterology | Admitting: Gastroenterology

## 2015-12-05 ENCOUNTER — Encounter
Admission: RE | Disposition: A | Discharge status: Home / Self Care | Payer: Self-pay | Source: Ambulatory Visit | Attending: Gastroenterology

## 2015-12-05 DIAGNOSIS — Z881 Allergy status to other antibiotic agents status: Secondary | ICD-10-CM | POA: Insufficient documentation

## 2015-12-05 DIAGNOSIS — F17200 Nicotine dependence, unspecified, uncomplicated: Secondary | ICD-10-CM | POA: Insufficient documentation

## 2015-12-05 DIAGNOSIS — K219 Gastro-esophageal reflux disease without esophagitis: Secondary | ICD-10-CM | POA: Diagnosis not present

## 2015-12-05 DIAGNOSIS — R194 Change in bowel habit: Secondary | ICD-10-CM | POA: Insufficient documentation

## 2015-12-05 DIAGNOSIS — R1013 Epigastric pain: Secondary | ICD-10-CM | POA: Insufficient documentation

## 2015-12-05 DIAGNOSIS — R1012 Left upper quadrant pain: Secondary | ICD-10-CM | POA: Insufficient documentation

## 2015-12-05 DIAGNOSIS — Z888 Allergy status to other drugs, medicaments and biological substances status: Secondary | ICD-10-CM | POA: Diagnosis not present

## 2015-12-05 DIAGNOSIS — J45909 Unspecified asthma, uncomplicated: Secondary | ICD-10-CM | POA: Insufficient documentation

## 2015-12-05 DIAGNOSIS — K635 Polyp of colon: Secondary | ICD-10-CM | POA: Insufficient documentation

## 2015-12-05 DIAGNOSIS — Z886 Allergy status to analgesic agent status: Secondary | ICD-10-CM | POA: Diagnosis not present

## 2015-12-05 DIAGNOSIS — R109 Unspecified abdominal pain: Secondary | ICD-10-CM | POA: Diagnosis present

## 2015-12-05 DIAGNOSIS — Z8371 Family history of colonic polyps: Secondary | ICD-10-CM | POA: Insufficient documentation

## 2015-12-05 DIAGNOSIS — E039 Hypothyroidism, unspecified: Secondary | ICD-10-CM | POA: Insufficient documentation

## 2015-12-05 DIAGNOSIS — Z79899 Other long term (current) drug therapy: Secondary | ICD-10-CM | POA: Diagnosis not present

## 2015-12-05 HISTORY — DX: Unspecified asthma, uncomplicated: J45.909

## 2015-12-05 HISTORY — PX: COLONOSCOPY WITH PROPOFOL: SHX5780

## 2015-12-05 HISTORY — DX: Hypothyroidism, unspecified: E03.9

## 2015-12-05 LAB — HCG, QUANTITATIVE, PREGNANCY: hCG, Beta Chain, Quant, S: <1 m[IU]/mL (ref <5)

## 2015-12-05 SURGERY — COLONOSCOPY WITH PROPOFOL
Anesthesia: General

## 2015-12-05 MED ORDER — SODIUM CHLORIDE 0.9 % IV SOLN
INTRAVENOUS | Status: DC
  Administered 2015-12-05: 13:00:00 via INTRAVENOUS

## 2015-12-05 MED ORDER — IPRATROPIUM-ALBUTEROL 0.5-2.5 (3) MG/3ML IN SOLN
RESPIRATORY_TRACT | Status: AC
  Filled 2015-12-05: qty 3

## 2015-12-05 MED ORDER — IPRATROPIUM-ALBUTEROL 0.5-2.5 (3) MG/3ML IN SOLN
3.0000 mL | Freq: Four times a day (QID) | RESPIRATORY_TRACT | Status: DC
Start: 2015-12-05 — End: 2015-12-06
  Administered 2015-12-05: 14:00:00 via RESPIRATORY_TRACT

## 2015-12-05 MED ORDER — SODIUM CHLORIDE 0.9 % IV SOLN
INTRAVENOUS | Status: DC
  Administered 2015-12-05 (×2): via INTRAVENOUS

## 2015-12-05 NOTE — Anesthesia Postprocedure Evaluation (Signed)
Anesthesia Post Note  Patient: Maureen EspyRebecca Getman  Procedure(s) Performed: Procedure(s) (LRB): COLONOSCOPY WITH PROPOFOL (N/A)  Patient location during evaluation: PACU Anesthesia Type: General Level of consciousness: awake and alert Pain management: pain level controlled Vital Signs Assessment: post-procedure vital signs reviewed and stable Respiratory status: spontaneous breathing and respiratory function stable Cardiovascular status: stable Anesthetic complications: no    Last Vitals:  Filed Vitals:   12/05/15 1239 12/05/15 1537  BP: 144/84 116/82  Pulse: 90 100  Temp: 36.4 C 36.3 C  Resp: 22 17    Last Pain:  Filed Vitals:   12/05/15 1539  PainSc: 0-No pain                 Wilburt Messina K

## 2015-12-05 NOTE — H&P (Signed)
Outpatient short stay form Pre-procedure 12/05/2015 2:35 PM Christena DeemMartin U Reon Hunley MD  Primary Physician: Dr. Lacie ScottsNiemeyer  Reason for visit:  EGD and colonoscopy  History of present illness:  Patient is a 25 year old female presenting today for EGD and colonoscopy. 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 She has 2 sisters also with adenomatous polyps at a young age. She has had irregular bowel habits mostly loose stools. He has seen no blood in the stool or black bowel movements.    Current facility-administered medications:  .  0.9 %  sodium chloride infusion, , Intravenous, Continuous, Christena DeemMartin U [place] Ndiaye, MD, Last Rate: 20 mL/hr at 12/05/15 1328 .  0.9 %  sodium chloride infusion, , Intravenous, Continuous, Christena DeemMartin U Bentleigh Stankus, MD .  ipratropium-albuterol (DUONEB) 0.5-2.5 (3) MG/3ML nebulizer solution 3 mL, 3 mL, Nebulization, Q6H, Christena DeemMartin U Takayla Baillie, MD  Prescriptions prior to admission  Medication Sig Dispense Refill Last Dose  . albuterol (PROVENTIL HFA;VENTOLIN HFA) 108 (90 Base) MCG/ACT inhaler Inhale 2 puffs into the lungs every 6 (six) hours as needed for wheezing or shortness of breath. 1 Inhaler 2 Past Week at Unknown time  . ascorbic acid (VITAMIN C) 500 MG tablet Take 500 mg by mouth daily.   12/04/2015 at 0900  . famotidine (PEPCID) 40 MG tablet Take 40 mg by mouth daily.   12/04/2015 at 2000  . levothyroxine (SYNTHROID, LEVOTHROID) 25 MCG tablet Take 25 mcg by mouth daily before breakfast.   12/05/2015 at 0900  . lubiprostone (AMITIZA) 24 MCG capsule Take 24 mcg by mouth 2 (two) times daily with a meal.   12/04/2015 at 0900  . magnesium oxide (MAG-OX) 400 MG tablet Take 400 mg by mouth daily.   12/04/2015 at 2000  . norgestimate-ethinyl estradiol (ORTHO-CYCLEN,SPRINTEC,PREVIFEM) 0.25-35 MG-MCG tablet Take 1 tablet by mouth daily.   12/05/2015 at Unknown time  . pantoprazole  (PROTONIX) 40 MG tablet Take 40 mg by mouth daily.   12/05/2015 at 0900  . brompheniramine-pseudoephedrine-DM 30-2-10 MG/5ML syrup Take 5 mLs by mouth 4 (four) times daily as needed. (Patient not taking: Reported on 12/05/2015) 120 mL 0 Completed Course at Unknown time  . dicyclomine (BENTYL) 20 MG tablet Take 1 tablet (20 mg total) by mouth 3 (three) times daily as needed for spasms. (Patient not taking: Reported on 12/05/2015) 20 tablet 0 Not Taking at Unknown time  . dicyclomine (BENTYL) 20 MG tablet Take 1 tablet (20 mg total) by mouth 3 (three) times daily as needed for spasms. 30 tablet 0      Allergies  Allergen Reactions  . Clindamycin/Lincomycin   . Ibuprofen Other (See Comments)  . Methylprednisolone Other (See Comments)  . Tramadol Other (See Comments)  . Zithromax [Azithromycin] Other (See Comments)  . Zoloft [Sertraline Hcl] Other (See Comments)  . Clarithromycin Rash     Past Medical History  Diagnosis Date  . Bronchitis   . Asthma   . Hypothyroidism     Review of systems:      Physical Exam    Heart and lungs: Regular rate and rhythm without rub or gallop, lungs show intermittent pops and wheezes in the lower bases bilaterally posterior    HEENT: Normocephalic atraumatic eyes are anicteric    Other:     Pertinant exam for procedure: Soft nontender nondistended bowel sounds positive normoactive, obese.    Planned proceedures: She was scheduled today for an  EGD and colonoscopy. I did not know that she had been on a antibiotic up through and including today and tomorrow for some issues with bronchitis. She states that her symptoms are much improved. We'll proceed with the colonoscopy which will not involve placing instrument past the airway which may be somewhat irritable still at this point. She discussed thoroughly with anesthesia. We will have her back in the outpatient clinic in a couple of weeks and see how she is doing symptomatically prior to re-arranging for  an upper scope.  I have discussed the risks benefits and complications of procedures to include not limited to bleeding, infection, perforation and the risk of sedation and the patient wishes to proceed.   Christena Deem, MD Gastroenterology 12/05/2015  2:35 PM

## 2015-12-05 NOTE — Op Note (Signed)
[facility] Gastroenterology Patient Name: Maureen EspyRebecca Roston Procedure Date: 12/05/2015 2:44 PM MRN: [id number] Account #: [id number] Date of Birth: 29-Oct-1991 Admit Type: Outpatient Age: [age] Room: [facility] ENDO ROOM 3 Gender: Female Note Status: Finalized Procedure:         Colonoscopy Indications:       Abdominal pain in the left upper quadrant, Change in bowel                     habits Providers:         Christena DeemMartin U. Cinnamon Morency, MD Referring MD:      Meindert A. Lacie ScottsNiemeyer, MD (Referring MD) Medicines:         Monitored Anesthesia Care Complications:     No immediate complications. Procedure:         Pre-Anesthesia Assessment:                    - ASA Grade Assessment: III - A patient with severe                     systemic disease.                    After obtaining informed consent, the colonoscope was                     passed under direct vision. Throughout the procedure, the                     patient's blood pressure, pulse, and oxygen saturations                     were monitored continuously. The Colonoscope was                     introduced through the anus and advanced to the the cecum,                     identified by appendiceal orifice and ileocecal valve. The                     colonoscopy was performed without difficulty. The patient                     tolerated the procedure well. The quality of the bowel                     preparation was fair. Findings:      A 3 mm polyp was found in the mid sigmoid colon. The polyp was sessile.       The polyp was removed with a cold biopsy forceps. Resection and       retrieval were complete.      The exam was otherwise normal throughout the examined colon.      The digital rectal exam was normal. Impression:        - One 3 mm polyp in the mid sigmoid colon. Resected and                     retrieved.                    - Biopsies were taken with a cold forceps from the right  colon and  left colon for evaluation of microscopic colitis. Recommendation:    - Await pathology results. Procedure Code(s): --- Professional ---                    [id number], Colonoscopy, flexible; with biopsy, single or                     multiple Diagnosis Code(s): --- Professional ---                    D12.5, Benign neoplasm of sigmoid colon                    R10.12, Left upper quadrant pain                    R19.4, Change in bowel habit CPT copyright 2014 American Medical Association. All rights reserved. The codes documented in this report are preliminary and upon coder review may  be revised to meet current compliance requirements. Christena Deem, MD 12/05/2015 3:40:21 PM This report has been signed electronically. Number of Addenda: 0 Note Initiated On: 12/05/2015 2:44 PM Scope Withdrawal Time: 0 hours 11 minutes 4 seconds  Total Procedure Duration: 0 hours 21 minutes 56 seconds       [facility]

## 2015-12-05 NOTE — Anesthesia Preprocedure Evaluation (Signed)
Anesthesia Evaluation  Patient identified by MRN, date of birth, ID band Patient awake    Reviewed: Allergy & Precautions, NPO status , Patient's Chart, lab work & pertinent test results  History of Anesthesia Complications Negative for: history of anesthetic complications  Airway Mallampati: I       Dental  (+) Teeth Intact   Pulmonary neg pulmonary ROS, asthma , Current Smoker (quit x 4 weeks),           Cardiovascular negative cardio ROS       Neuro/Psych negative neurological ROS     GI/Hepatic negative GI ROS, Neg liver ROS, GERD  Medicated and Controlled,  Endo/Other  Hypothyroidism   Renal/GU negative Renal ROS     Musculoskeletal   Abdominal   Peds  Hematology negative hematology ROS (+)   Anesthesia Other Findings   Reproductive/Obstetrics                             Anesthesia Physical Anesthesia Plan  ASA: III  Anesthesia Plan: General   Post-op Pain Management:    Induction: Intravenous  Airway Management Planned: Nasal Cannula  Additional Equipment:   Intra-op Plan:   Post-operative Plan:   Informed Consent: I have reviewed the patients History and Physical, chart, labs and discussed the procedure including the risks, benefits and alternatives for the proposed anesthesia with the patient or authorized representative who has indicated his/her understanding and acceptance.     Plan Discussed with:   Anesthesia Plan Comments:         Anesthesia Quick Evaluation

## 2015-12-05 NOTE — Transfer of Care (Signed)
Immediate Anesthesia Transfer of Care Note  Patient: Maureen EspyRebecca Santos  Procedure(s) Performed: Procedure(s): COLONOSCOPY WITH PROPOFOL (N/A)  Patient Location: PACU and Endoscopy Unit  Anesthesia Type:General  Level of Consciousness: awake, alert  and oriented  Airway & Oxygen Therapy: Patient Spontanous Breathing and Patient connected to nasal cannula oxygen  Post-op Assessment: Report given to RN and Post -op Vital signs reviewed and stable  Post vital signs: Reviewed and stable  Last Vitals: 15:38 100% 93 hr 20resp 116/82 Filed Vitals:   12/05/15 1239  BP: 144/84  Pulse: 90  Temp: 36.4 C  Resp: 22    Complications: No apparent anesthesia complications

## 2015-12-09 LAB — SURGICAL PATHOLOGY

## 2015-12-10 ENCOUNTER — Encounter: Payer: Self-pay | Admitting: Gastroenterology

## 2016-03-25 IMAGING — CT CT ABD-PELV W/ CM
1 of 2 series · 15 of 32 positions shown, 19 images · IV contrast (omnipaque)
Comparison: CT abdomen and pelvis September 16, 2015

CLINICAL DATA: RIGHT lower quadrant pain for 4 days, nausea and
fever beginning today. Weakness.

EXAM:
CT ABDOMEN AND PELVIS WITH CONTRAST
TECHNIQUE: Multidetector CT imaging of the abdomen and pelvis was performed
using the standard protocol following bolus administration of
intravenous contrast.
CONTRAST:  125mL OMNIPAQUE IOHEXOL 350 MG/ML SOLN

[Series 2: routine abd pel with · axial · 0.92mm/px · z∈[-1006,-510]mm · 15 of 109 slices shown, 19 images]
[im 5/109  soft-tissue]
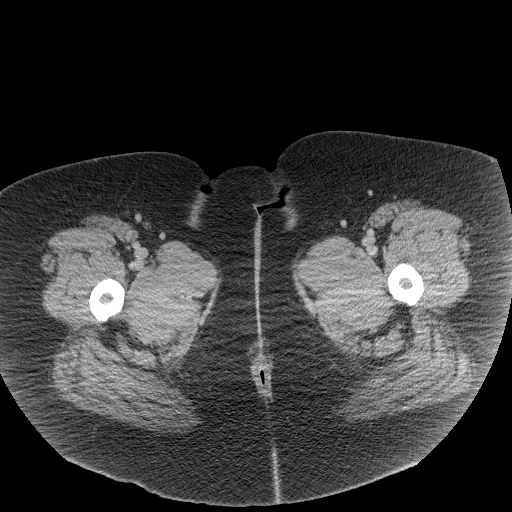
[im 5/109  bone]
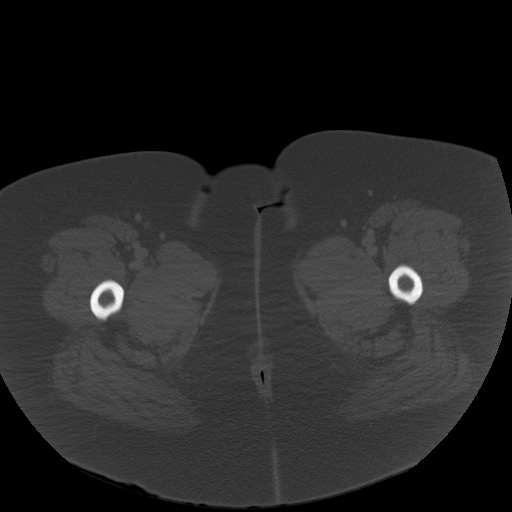
[im 14/109  soft-tissue]
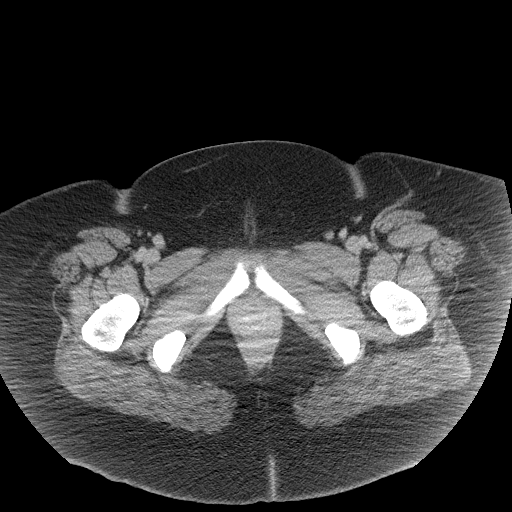
[im 23/109  soft-tissue]
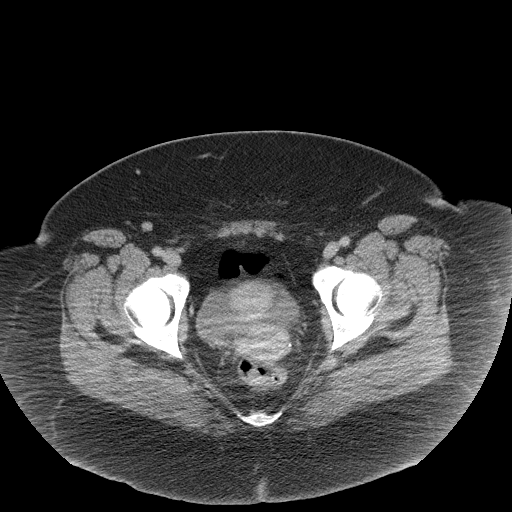
[im 32/109  soft-tissue]
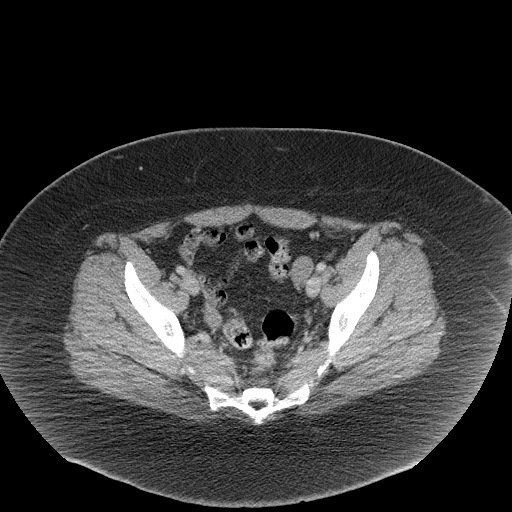
[im 37/109  soft-tissue]
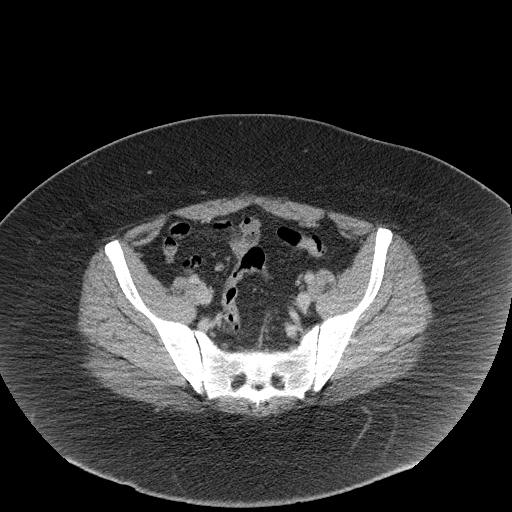
[im 46/109  soft-tissue]
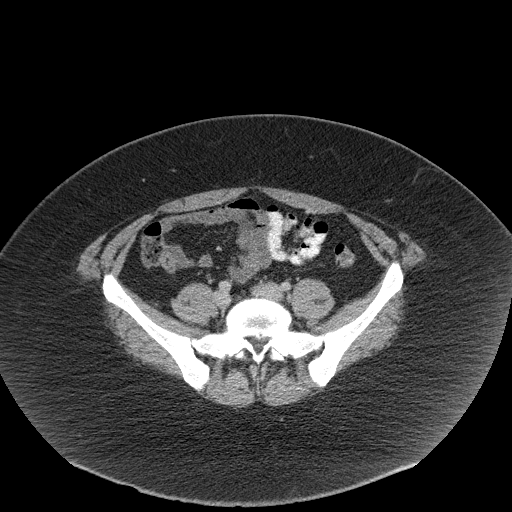
[im 55/109  soft-tissue]
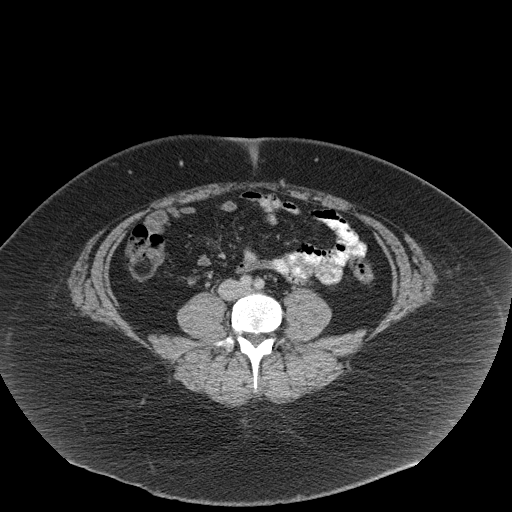
[im 64/109  soft-tissue]
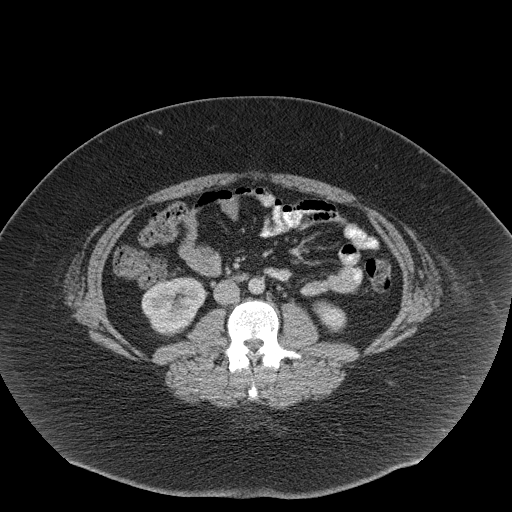
[im 73/109  soft-tissue]
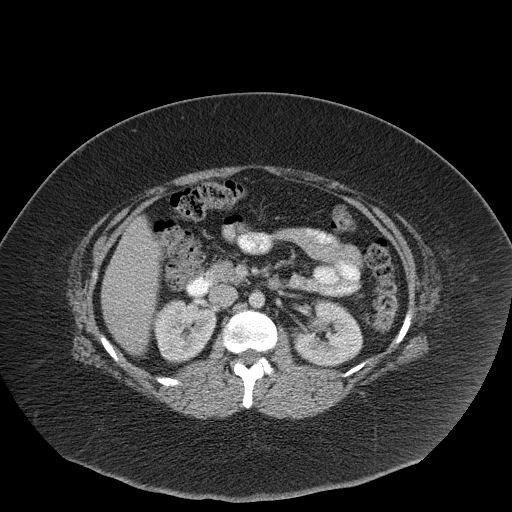
[im 73/109  bone]
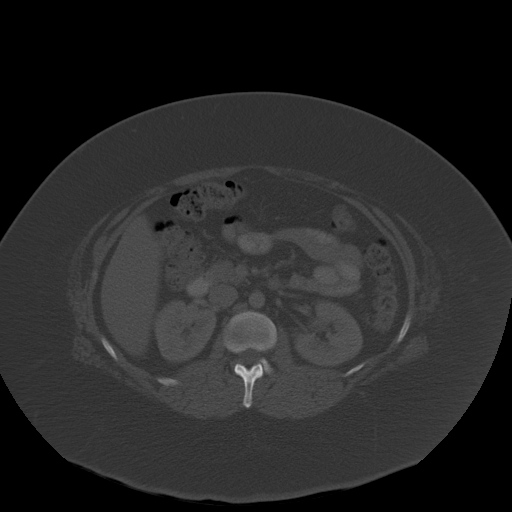
[im 77/109  soft-tissue]
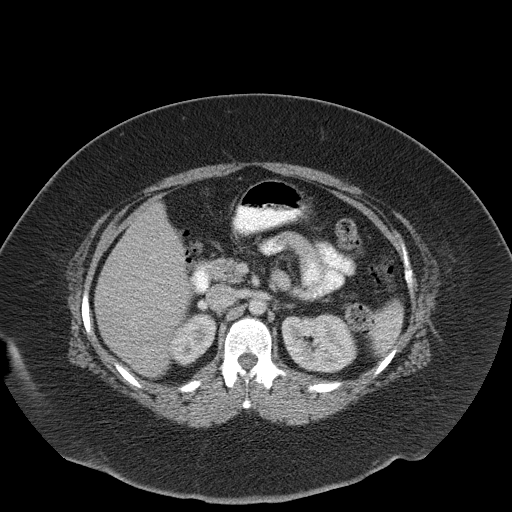
[im 86/109  soft-tissue]
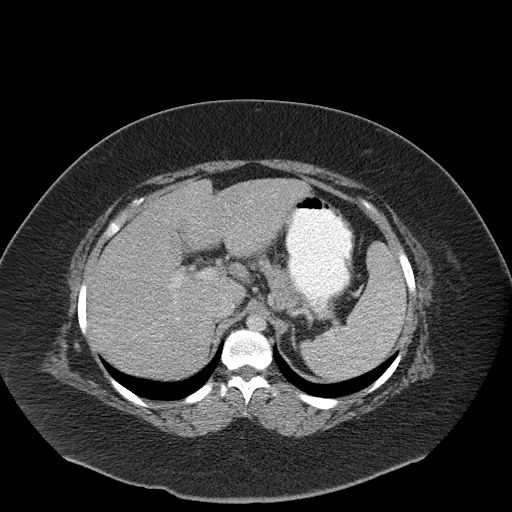
[im 91/109  lung]
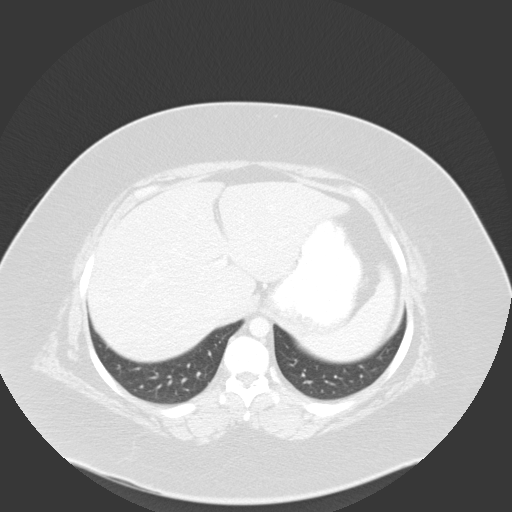
[im 95/109  soft-tissue]
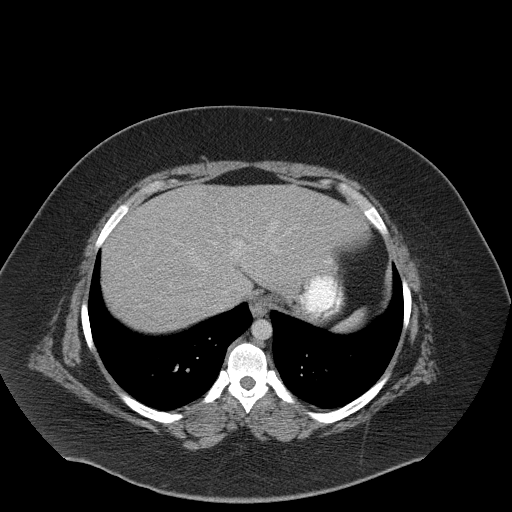
[im 95/109  lung]
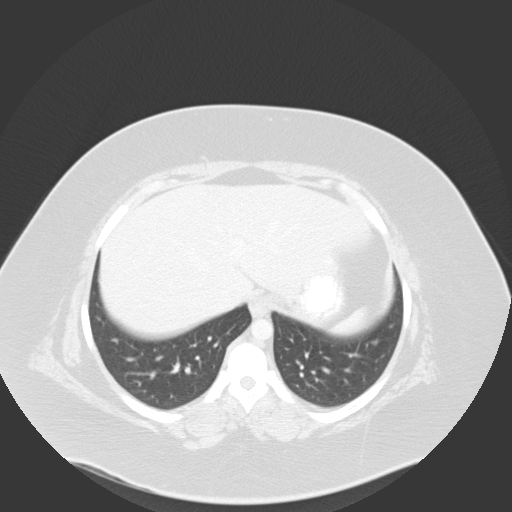
[im 100/109  lung]
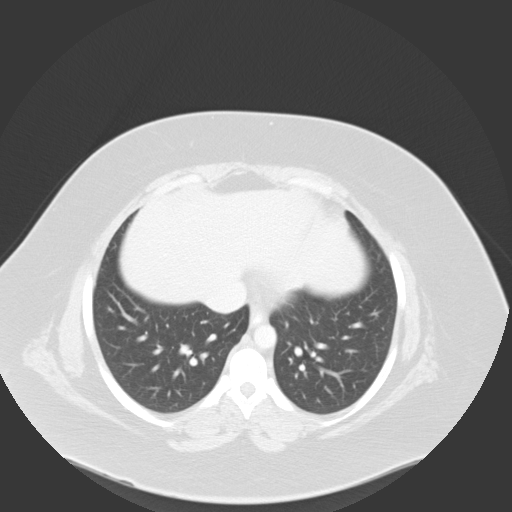
[im 104/109  soft-tissue]
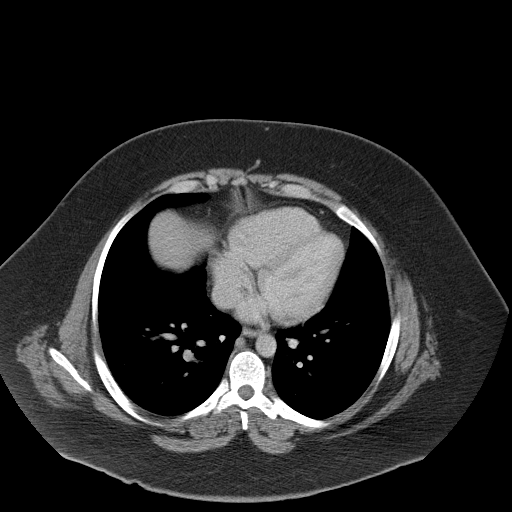
[im 104/109  lung]
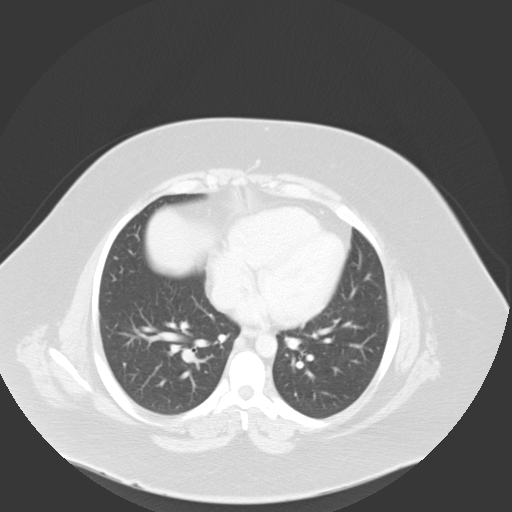

[15 of 32 positions shown; findings below may reference images not displayed]

FINDINGS: Large body habitus results in overall noisy image quality.

LUNG BASES: Included view of the lung bases are clear. Visualized
heart and pericardium are unremarkable. Small amount of contrast in
the distal esophagus could represent residual or refluxed contrast.

SOLID ORGANS: The liver demonstrates faint hypodensity around the
hilum, most compatible with focal fatty infiltration, otherwise
unremarkable. Spleen, gallbladder, pancreas and adrenal glands are
unremarkable.

GASTROINTESTINAL TRACT: Small hiatal hernia. The stomach, small and
large bowel are normal in course and caliber without inflammatory
changes. Enteric contrast has not yet reached the distal small
bowel. Mild amount of retained large bowel stool. Normal appendix.

KIDNEYS/ URINARY TRACT: Kidneys are orthotopic, demonstrating
symmetric enhancement. No nephrolithiasis, hydronephrosis or solid
renal masses. The unopacified ureters are normal in course and
caliber. Urinary bladder is partially distended and unremarkable.

PERITONEUM/RETROPERITONEUM: Aortoiliac vessels are normal in course
and caliber. No lymphadenopathy by CT size criteria. Subcentimeter
RIGHT lower quadrant lymph nodes are likely reactive. Internal
reproductive organs are unremarkable. No intraperitoneal free fluid
nor free air.

SOFT TISSUE/OSSEOUS STRUCTURES: Non-suspicious. Moderate broad-based
disc bulge L3-4 resulting mild canal stenosis.
IMPRESSION: No acute intra-abdominal or pelvic process.  Normal appendix.

Mild amount of retained large bowel stool without bowel obstruction.

## 2016-04-06 IMAGING — CR DG CHEST 2V
1 series · 2 of 2 positions shown · non-contrast
Comparison: None.

CLINICAL DATA: Cough for 1 day. Difficulty breathing for 2 days.
Dyspnea.

EXAM:
CHEST  2 VIEW

[Series 1: dg chest 2 view · 0.14mm/px · 2 of 2 slices shown]
[im 1/2]
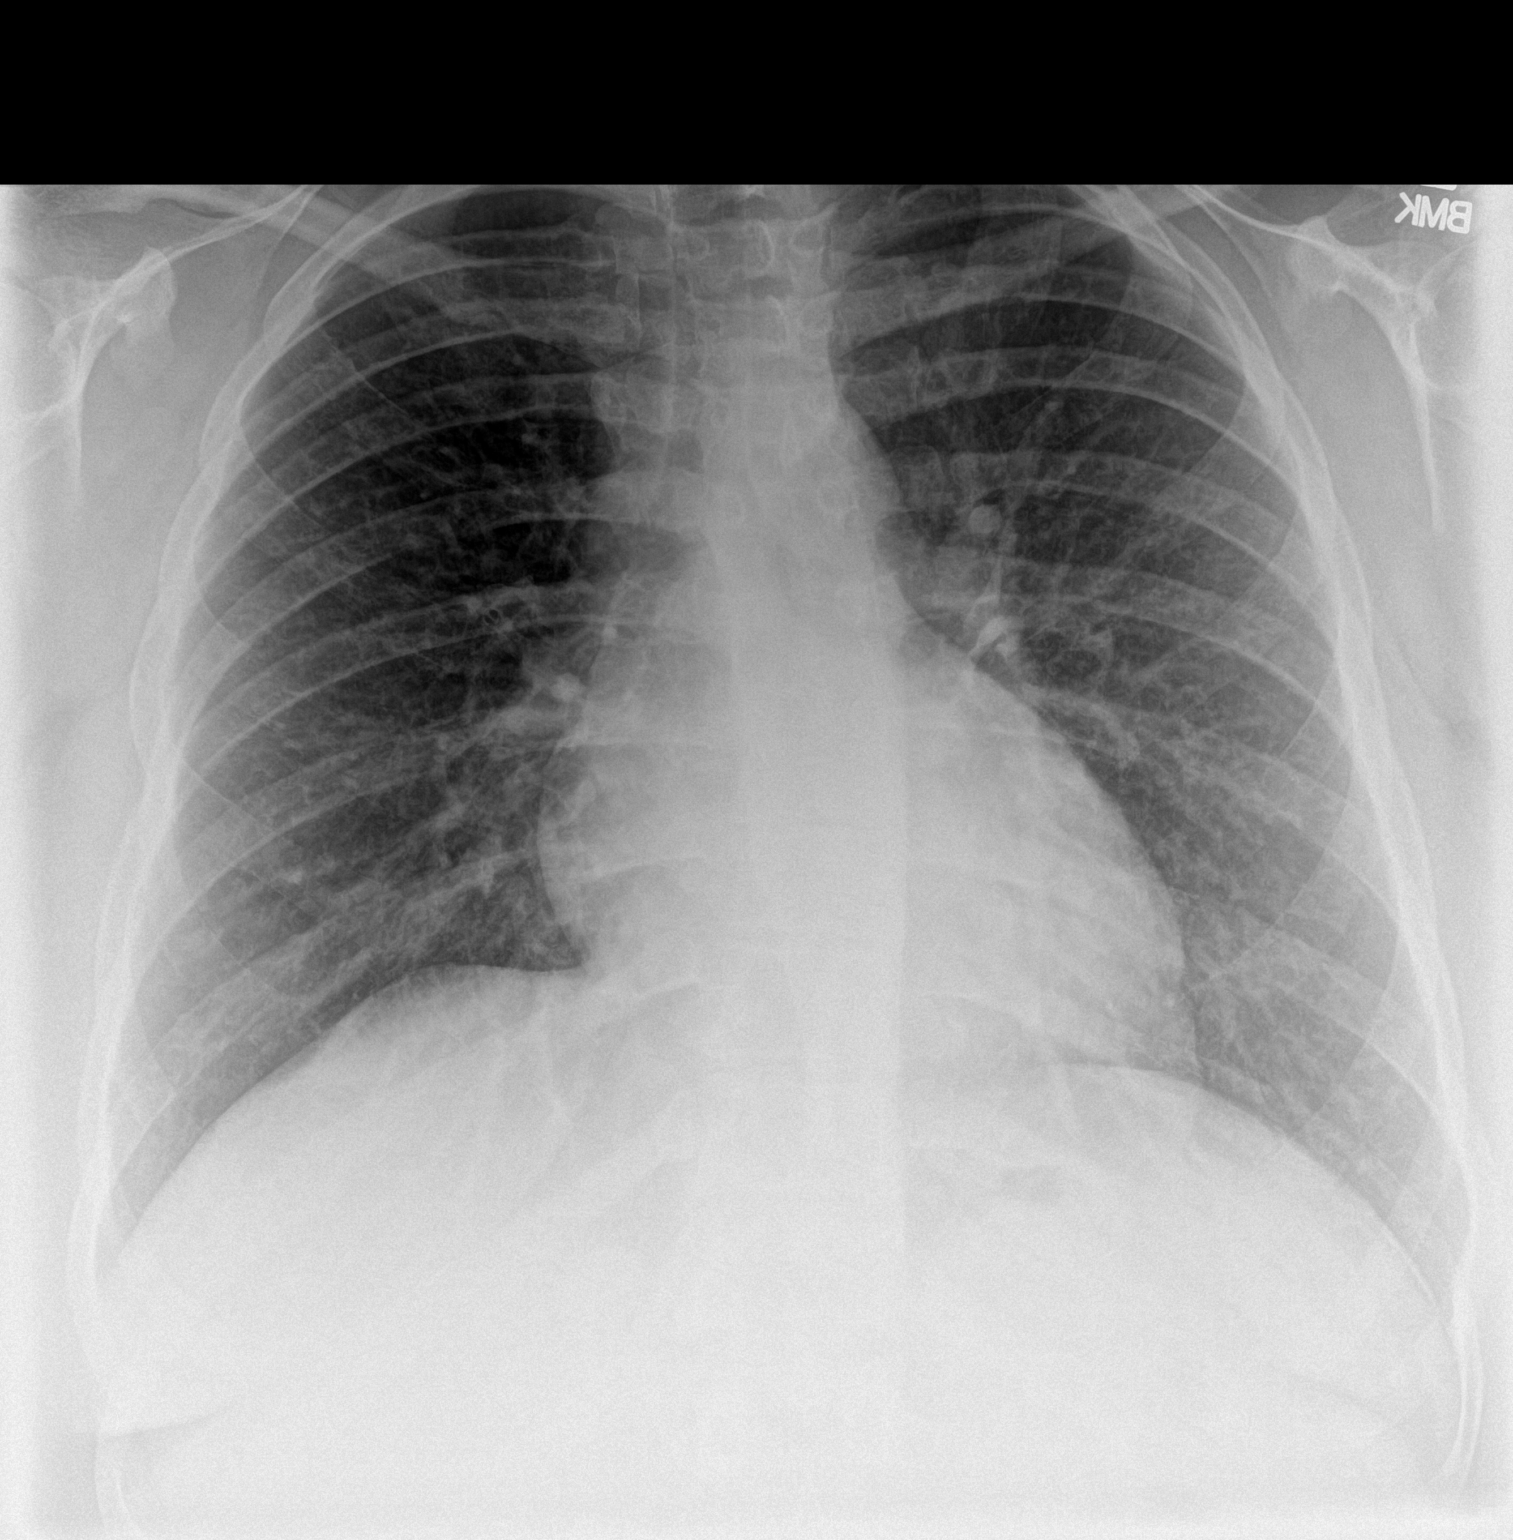
[im 2/2]
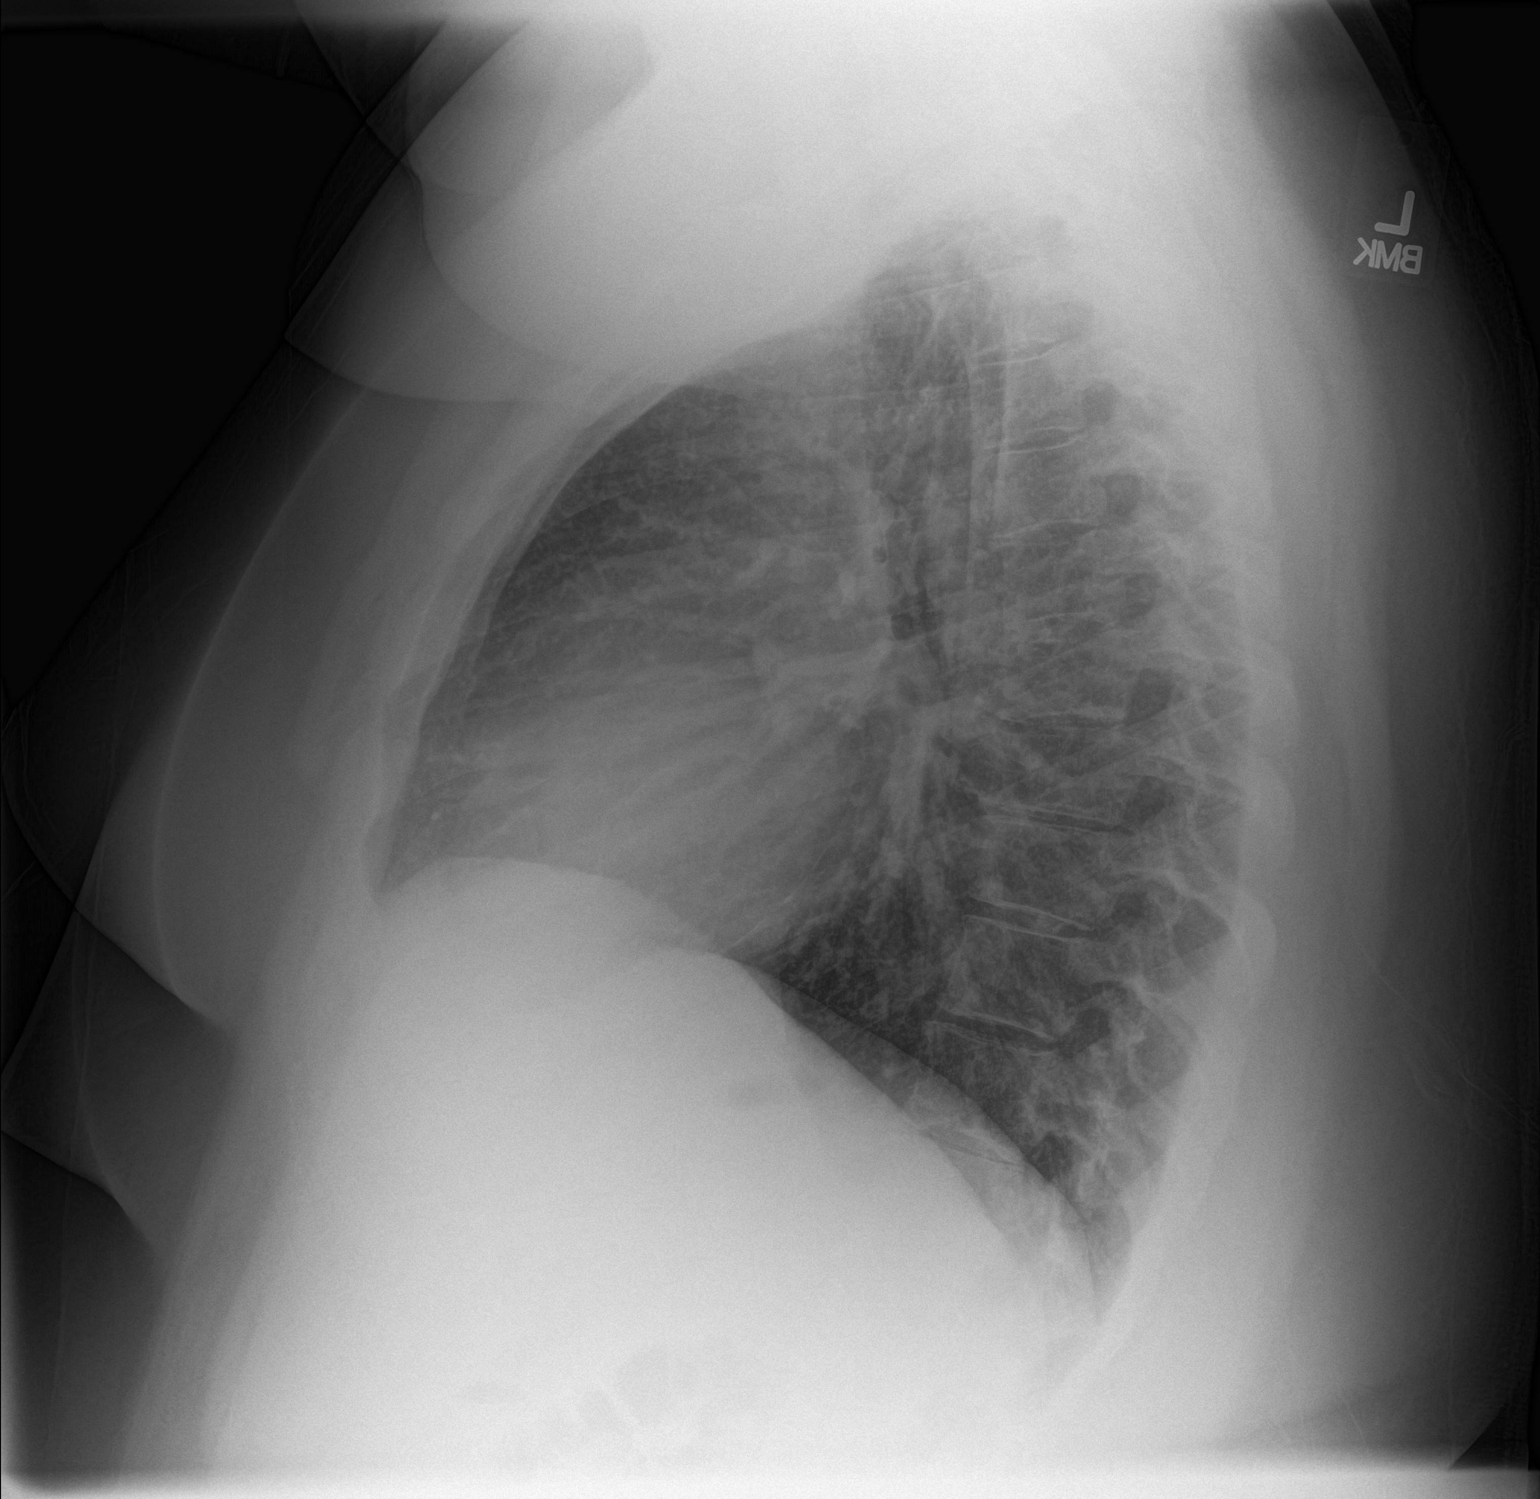

[2 of 2 positions shown; findings below may reference images not displayed]

FINDINGS: The cardiomediastinal contours are normal. Mild bronchial
thickening, left greater than right. Pulmonary vasculature is
normal. No consolidation, pleural effusion, or pneumothorax. No
acute osseous abnormalities are seen.
IMPRESSION: Mild bronchial thickening, left greater than right, bronchitis or
asthma.

## 2019-09-28 ENCOUNTER — Emergency Department: Admit: 2019-09-29 | Payer: PRIVATE HEALTH INSURANCE

## 2019-09-28 DIAGNOSIS — R519 Headache, unspecified: Secondary | ICD-10-CM

## 2019-09-28 NOTE — ED Provider Notes (Signed)
[facility]  Emergency Department  Faculty Attestation       I performed a history and physical examination of the patient and discussed management with the resident. I reviewed the resident???s note and agree with the documented findings including all diagnostic interpretations and plan of care. Any areas of disagreement are noted on the chart. I was personally present for the key portions of any procedures. I have documented in the chart those procedures where I was not present during the key portions. I have reviewed the emergency nurses triage note. I agree with the chief complaint, past medical history, past surgical history, allergies, medications, social and family history as documented unless otherwise noted below. Documentation of the HPI, Physical Exam and Medical Decision Making performed by scribes is based on my personal performance of the HPI, PE and MDM. For Physician Assistant/ Nurse Practitioner cases/documentation I have personally evaluated this patient and have completed at least one if not all key elements of the E/M (history, physical exam, and MDM). Additional findings are as noted.    Pertinent Comments     Primary Care Physician: No primary care provider on file.    ED Triage Vitals [09/28/19 2148]   BP Temp Temp Source Pulse Resp SpO2 Height Weight   -- 96.8 ??F (36 ??C) Temporal 98 16 96 % 5\' 10"  (1.778 m) (!) 415 lb (188.2 kg)        This is a 28 y.o. female who presents to the Emergency Department with complaint of headache and rib pain.     Symptomatic treatment and d/c       CRITICAL CARE: None    Julianne Rice, MD  Attending Emergency Physician        Julianne Rice, MD  10/04/19 [phone]

## 2019-09-28 NOTE — ED Provider Notes (Signed)
[facility] ED  Emergency Department Encounter  EmergencyMedicine Resident     Pt Name:Haley Figueroa  MRN: [id number]  Birthdate [birth year]  Date of evaluation: 09/28/19  PCP:  No primary care provider on file.    CHIEF COMPLAINT       Chief Complaint   Patient presents with   ??? Headache   ??? Dizziness       HISTORY OF PRESENT ILLNESS  (Location/Symptom, Timing/Onset, Context/Setting, Quality, Duration, Modifying Factors, Severity.)      Haley Figueroa is a 28 y.o. female who presents with patient arrives for evaluation of headache associated with dizziness, "like when a fall over walking ".  Onset was 1 hour ago.  Gradual onset.  Rated at a 7 out of 10.  Patient took 800 mg of Motrin prior to arrival.  States that she gets headaches but this 1 is worse than her usual headache.  she denies any nausea, vomiting, weakness numbness tingling.  Additionally she complains of left-sided rib pain, worse on certain movements and deep inspirations.  No trauma.  No cough.  Denies any chest pain.      Patient also states her blood sugars have been running high.  She is a type 2 insulin-dependent diabetic.    PAST MEDICAL / SURGICAL / SOCIAL / FAMILY HISTORY      has a past medical history of Anxiety, Borderline personality disorder (HCC), Colon polyp, Depression, Diabetes mellitus (HCC), Hypertension, and Hypotension.     has no past surgical history on file.      Social History     Socioeconomic History   ??? Marital status: Single     Spouse name: Not on file   ??? Number of children: Not on file   ??? Years of education: Not on file   ??? Highest education level: Not on file   Occupational History   ??? Not on file   Social Needs   ??? Financial resource strain: Not on file   ??? Food insecurity     Worry: Not on file     Inability: Not on file   ??? Transportation needs     Medical: Not on file     Non-medical: Not on file   Tobacco Use   ??? Smoking status: Former Smoker   ??? Smokeless tobacco: Never Used   Substance and Sexual  Activity   ??? Alcohol use: Not on file     Comment: occ   ??? Drug use: Never   ??? Sexual activity: Not on file   Lifestyle   ??? Physical activity     Days per week: Not on file     Minutes per session: Not on file   ??? Stress: Not on file   Relationships   ??? Social Wellsite geologist on phone: Not on file     Gets together: Not on file     Attends religious service: Not on file     Active member of club or organization: Not on file     Attends meetings of clubs or organizations: Not on file     Relationship status: Not on file   ??? Intimate partner violence     Fear of current or ex partner: Not on file     Emotionally abused: Not on file     Physically abused: Not on file     Forced sexual activity: Not on file   Other Topics Concern   ??? Not on file  Social History Narrative   ??? Not on file       History reviewed. No pertinent family history.    Allergies:  Metformin and related; Tramadol; and Zoloft [sertraline hcl]    Home Medications:  Prior to Admission medications    Medication Sig Start Date End Date Taking? Authorizing Provider   citalopram (CELEXA) 10 MG tablet Take 10 mg by mouth daily   Yes Historical Provider, MD   lurasidone (LATUDA) 40 MG TABS tablet Take 60 mg by mouth nightly   Yes Historical Provider, MD   levothyroxine (SYNTHROID) 75 MCG tablet Take 75 mcg by mouth Daily   Yes Historical Provider, MD   lisinopril (PRINIVIL;ZESTRIL) 10 MG tablet Take 10 mg by mouth daily   Yes Historical Provider, MD   magnesium oxide (MAG-OX) 400 MG tablet Take 400 mg by mouth daily   Yes Historical Provider, MD   pantoprazole (PROTONIX) 40 MG tablet Take 40 mg by mouth daily   Yes Historical Provider, MD   prazosin (MINIPRESS) 5 MG capsule Take 5 mg by mouth nightly   Yes Historical Provider, MD   hydrOXYzine (VISTARIL) 50 MG capsule Take 50 mg by mouth 3 times daily as needed for Itching   Yes Historical Provider, MD       REVIEW OF SYSTEMS    (2-9 systems for level 4, 10 or more for level 5)      Review of Systems    Constitutional: Negative for chills and fever.   HENT: Negative for congestion and sore throat.    Eyes: Negative for visual disturbance.   Respiratory: Negative for cough and shortness of breath.    Cardiovascular: Negative for chest pain.   Gastrointestinal: Negative for abdominal pain, nausea and vomiting.   Genitourinary: Negative for dysuria and frequency.   Musculoskeletal: Negative for neck stiffness.   Skin: Negative for rash.   Neurological: Positive for dizziness and headaches. Negative for seizures, weakness and numbness.        PHYSICAL EXAM   (up to 7 for level 4, 8 or more for level 5)      INITIAL VITALS:   Pulse 98    Temp 96.8 ??F (36 ??C) (Temporal)    Resp 16    Ht 5\' 10"  (1.778 m)    Wt (!) 415 lb (188.2 kg)    LMP  (LMP Unknown)    SpO2 96%    BMI 59.55 kg/m??      Vitals:    09/28/19 2148 09/29/19 0041   Pulse: 98    Resp: 16 16   Temp: 96.8 ??F (36 ??C)    TempSrc: Temporal    SpO2: 96%    Weight: (!) 415 lb (188.2 kg)    Height: 5\' 10"  (1.778 m)         Physical Exam  Vitals signs reviewed.   Constitutional:       General: She is not in acute distress.     Appearance: She is well-developed. She is obese. She is not ill-appearing, toxic-appearing or diaphoretic.   HENT:      Head: Normocephalic and atraumatic.   Eyes:      Extraocular Movements: Extraocular movements intact.      Pupils: Pupils are equal, round, and reactive to light.   Neck:      Musculoskeletal: Normal range of motion and neck supple.   Cardiovascular:      Rate and Rhythm: Normal rate and regular rhythm.   Pulmonary:  Effort: Pulmonary effort is normal.      Breath sounds: Normal breath sounds.   Abdominal:      General: Bowel sounds are normal. There is no distension.      Palpations: Abdomen is soft.      Tenderness: There is no abdominal tenderness.   Musculoskeletal: Normal range of motion.   Skin:     General: Skin is warm and dry.   Neurological:      General: No focal deficit present.      Mental Status: She is  alert and oriented to person, place, and time.      Cranial Nerves: No cranial nerve deficit.      Sensory: No sensory deficit.      Motor: No weakness.      Gait: Gait normal.      Comments: Patient is in bed no acute distress, pupils equal round reactive light, cranial nerves II through XII intact, no focal weakness, no sensory deficits.  Normal finger-nose.  No nystagmus.         DIFFERENTIAL  DIAGNOSIS     PLAN (LABS / IMAGING / EKG):  Orders Placed This Encounter   Procedures   ??? XR CHEST (2 VW)   ??? CBC Auto Differential   ??? Basic Metabolic Panel w/ Reflex to MG   ??? EKG 12 Lead       MEDICATIONS ORDERED:  Orders Placed This Encounter   Medications   ??? prochlorperazine (COMPAZINE) injection 5 mg   ??? diphenhydrAMINE (BENADRYL) injection 12.5 mg   ??? prochlorperazine (COMPAZINE) injection 5 mg       DIAGNOSTIC RESULTS / EMERGENCY DEPARTMENT COURSE / MDM   LAB RESULTS:  Results for orders placed or performed during the hospital encounter of 09/28/19   CBC Auto Differential   Result Value Ref Range    WBC 8.8 3.5 - 11.0 k/uL    RBC 4.03 4.0 - 5.2 m/uL    Hemoglobin 11.4 (L) 12.0 - 16.0 g/dL    Hematocrit 04.5 (L) 36 - 46 %    MCV 83.4 80 - 100 fL    MCH 28.2 26 - 34 pg    MCHC 33.9 31 - 37 g/dL    RDW 40.9 81.1 - 91.4 %    Platelets 297 150 - 450 k/uL    MPV 7.7 6.0 - 12.0 fL    NRBC Automated NOT REPORTED per 100 WBC    Differential Type NOT REPORTED     Seg Neutrophils 58 36 - 66 %    Lymphocytes 33 24 - 44 %    Monocytes 4 1 - 7 %    Eosinophils % 4 0 - 4 %    Basophils 1 0 - 2 %    Immature Granulocytes NOT REPORTED 0 %    Segs Absolute 5.00 1.3 - 9.1 k/uL    Absolute Lymph # 2.90 1.0 - 4.8 k/uL    Absolute Mono # 0.40 0.1 - 1.3 k/uL    Absolute Eos # 0.40 0.0 - 0.4 k/uL    Basophils Absolute 0.10 0.0 - 0.2 k/uL    Absolute Immature Granulocyte NOT REPORTED 0.00 - 0.30 k/uL    WBC Morphology NOT REPORTED     RBC Morphology NOT REPORTED     Platelet Estimate NOT REPORTED    Basic Metabolic Panel w/ Reflex to MG    Result Value Ref Range    Glucose 241 (H) 70 - 99 mg/dL    BUN  13 6 - 20 mg/dL    CREATININE 3.79 (H) 0.50 - 0.90 mg/dL    Bun/Cre Ratio NOT REPORTED 9 - 20    Calcium 8.9 8.6 - 10.4 mg/dL    Sodium 432 761 - 470 mmol/L    Potassium 3.9 3.7 - 5.3 mmol/L    Chloride 102 98 - 107 mmol/L    CO2 22 20 - 31 mmol/L    Anion Gap 13 9 - 17 mmol/L    GFR Non-African American >60 >60 mL/min    GFR African American >60 >60 mL/min    GFR Comment          GFR Staging NOT REPORTED          RADIOLOGY:  XR CHEST (2 VW)   Final Result   No evidence of acute cardiopulmonary disease              EKG      All EKG's are interpreted by the Emergency Department Physician who either signs or Co-signs this chart in the absence of a cardiologist.      INITIAL IMPRESSION:     benign headache, rib pain    EMERGENCY DEPARTMENT COURSE & MDM:    Patient coming here for headache rated a 7 out of 10.  There is no associated neuro findings.  Plan for Compazine and Benadryl.  Does not meet MIPS criteria for CT head.  We will also obtain chest x-ray to obtain for pneumonia causing pleuritic chest pain, labs for hypoglycemia or infection.  Wells Score 0.  Doubt ACS giving no chest pain that is worse with exertion nausea, vomiting.  No prior cardiac history.  Patient is obese and diabetic however vital signs are within normal limits and she is quite young.    ED Course as of Sep 28 212   Fri Sep 28, 2019   2319 Glucose(!): 241 [CS]   2320 No DKA     Anion Gap: 13 [CS]   2320 CXR unremarble     XR CHEST (2 VW) [CS]   2324 Patient states her headache is improved it was a 7 out of 10 now is a 5 out of 10.  We will try 1 more dose of Compazine 5 mg for headache.    [CS]   Sat Sep 29, 2019   0028 Ultrasound of the eye, shows a retinal sheath diameter less than 0.4 cm bilaterally.  Normal pressure hydrocephalus ruled out.    [CS]   [id number] Patient is feeling better.  Plan discharge home.  Patient has Motrin at home for headaches.    [CS]      ED Course User  Index  [CS] Adrienne Mocha, DO         PROCEDURES:    CONSULTS:  None    CRITICAL CARE:  Please see attending note    FINAL IMPRESSION      1. Nonintractable headache, unspecified chronicity pattern, unspecified headache type        DISPOSITION / PLAN     DISPOSITION        PATIENT REFERRED TO:  No follow-up provider specified.    DISCHARGE MEDICATIONS:  Discharge Medication List as of 09/29/2019 12:30 AM          Adrienne Mocha, DO  Emergency Medicine Resident    (Please note that portions of thisnote were completed with a voice recognition program.  Efforts were made to edit the dictations but occasionally words are mis-transcribed.)  Mare Ferrari, DO  Resident  09/29/19 [phone]

## 2019-09-29 ENCOUNTER — Inpatient Hospital Stay
Admit: 2019-09-29 | Discharge: 2019-09-29 | Disposition: A | Discharge status: Home / Self Care | Payer: PRIVATE HEALTH INSURANCE | Attending: Emergency Medicine

## 2019-09-29 LAB — BASIC METABOLIC PANEL W/ REFLEX TO MG FOR LOW K
Anion Gap: 13 mmol/L (ref 9–17)
BUN: 13 mg/dL (ref 6–20)
CO2: 22 mmol/L (ref 20–31)
Calcium: 8.9 mg/dL (ref 8.6–10.4)
Chloride: 102 mmol/L (ref 98–107)
Creatinine: 0.94 mg/dL — ABNORMAL HIGH (ref 0.50–0.90)
GFR African American: >60 mL/min (ref >60)
GFR Non-African American: >60 mL/min (ref >60)
Glucose: 241 mg/dL — ABNORMAL HIGH (ref 70–99)
Potassium: 3.9 mmol/L (ref 3.7–5.3)
Sodium: 137 mmol/L (ref 135–144)

## 2019-09-29 LAB — EKG 12-LEAD
Atrial Rate: 84 {beats}/min
P Axis: 41 degrees
P-R Interval: 154 ms
Q-T Interval: 392 ms
QRS Duration: 84 ms
QTc Calculation (Bazett): 463 ms
R Axis: -5 degrees
T Axis: 11 degrees
Ventricular Rate: 84 {beats}/min

## 2019-09-29 LAB — CBC WITH AUTO DIFFERENTIAL
Absolute Eos #: 0.4 10*3/uL (ref 0.0–0.4)
Absolute Lymph #: 2.9 10*3/uL (ref 1.0–4.8)
Absolute Mono #: 0.4 10*3/uL (ref 0.1–1.3)
Basophils Absolute: 0.1 10*3/uL (ref 0.0–0.2)
Basophils: 1 % (ref 0–2)
Eosinophils %: 4 % (ref 0–4)
Hematocrit: 33.6 % — ABNORMAL LOW (ref 36–46)
Hemoglobin: 11.4 g/dL — ABNORMAL LOW (ref 12.0–16.0)
Lymphocytes: 33 % (ref 24–44)
MCH: 28.2 pg (ref 26–34)
MCHC: 33.9 g/dL (ref 31–37)
MCV: 83.4 fL (ref 80–100)
MPV: 7.7 fL (ref 6.0–12.0)
Monocytes: 4 % (ref 1–7)
Platelets: 297 10*3/uL (ref 150–450)
RBC: 4.03 m/uL (ref 4.0–5.2)
RDW: 14.7 % (ref 11.5–14.9)
Seg Neutrophils: 58 % (ref 36–66)
Segs Absolute: 5 10*3/uL (ref 1.3–9.1)
WBC: 8.8 10*3/uL (ref 3.5–11.0)

## 2019-09-29 MED ORDER — PROCHLORPERAZINE EDISYLATE 10 MG/2ML IJ SOLN
10 MG/2ML | Freq: Four times a day (QID) | INTRAMUSCULAR | Status: DC | PRN
Start: 2019-09-29 — End: 2019-09-29
  Administered 2019-09-29: 04:00:00 5 mg via INTRAVENOUS

## 2019-09-29 MED ORDER — PROCHLORPERAZINE EDISYLATE 10 MG/2ML IJ SOLN
10 MG/2ML | Freq: Once | INTRAMUSCULAR | Status: AC
Start: 2019-09-29 — End: 2019-09-29
  Administered 2019-09-29: 05:00:00 5 mg via INTRAVENOUS

## 2019-09-29 MED ORDER — DIPHENHYDRAMINE HCL 50 MG/ML IJ SOLN
50 MG/ML | Freq: Four times a day (QID) | INTRAMUSCULAR | Status: DC | PRN
Start: 2019-09-29 — End: 2019-09-29
  Administered 2019-09-29: 04:00:00 12.5 mg via INTRAVENOUS

## 2019-09-29 MED FILL — PROCHLORPERAZINE EDISYLATE 10 MG/2ML IJ SOLN: 10 MG/2ML | INTRAMUSCULAR | Qty: 2

## 2019-09-29 MED FILL — DIPHENHYDRAMINE HCL 50 MG/ML IJ SOLN: 50 mg/mL | INTRAMUSCULAR | Qty: 1

## 2019-09-29 NOTE — Discharge Instructions (Signed)
Here today for headache and rib pain.  Your chest x-ray is unremarkable lab work is in with normal limits except for elevated blood sugar.  Recommend you follow-up with your PCP as soon as possible regarding your diet medic management.  Please take Motrin 800 mg every 8 hours for pain.  Return to the emergency department immediately if you experience any new worsening concerning symptoms.
